# Patient Record
Sex: Female | Born: 1985 | Race: White | Hispanic: No | Smoking: Former smoker
Health system: Southern US, Community
[De-identification: ages and names within clinical notes are randomized; demographics above are authoritative.]

## PROBLEM LIST (undated history)

## (undated) ENCOUNTER — Inpatient Hospital Stay (HOSPITAL_COMMUNITY): Payer: Self-pay

## (undated) DIAGNOSIS — F32A Depression, unspecified: Secondary | ICD-10-CM

## (undated) DIAGNOSIS — B999 Unspecified infectious disease: Secondary | ICD-10-CM

## (undated) DIAGNOSIS — F1911 Other psychoactive substance abuse, in remission: Secondary | ICD-10-CM

## (undated) DIAGNOSIS — F329 Major depressive disorder, single episode, unspecified: Secondary | ICD-10-CM

## (undated) DIAGNOSIS — A549 Gonococcal infection, unspecified: Secondary | ICD-10-CM

## (undated) DIAGNOSIS — F419 Anxiety disorder, unspecified: Secondary | ICD-10-CM

## (undated) DIAGNOSIS — K8020 Calculus of gallbladder without cholecystitis without obstruction: Secondary | ICD-10-CM

## (undated) HISTORY — PX: THERAPEUTIC ABORTION: SHX798

## (undated) HISTORY — DX: Calculus of gallbladder without cholecystitis without obstruction: K80.20

## (undated) HISTORY — PX: NO PAST SURGERIES: SHX2092

## (undated) HISTORY — DX: Other psychoactive substance abuse, in remission: F19.11

## (undated) HISTORY — PX: WISDOM TOOTH EXTRACTION: SHX21

---
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
## 2013-07-22 DIAGNOSIS — F4310 Post-traumatic stress disorder, unspecified: Secondary | ICD-10-CM | POA: Insufficient documentation

## 2015-08-25 DIAGNOSIS — F902 Attention-deficit hyperactivity disorder, combined type: Secondary | ICD-10-CM | POA: Insufficient documentation

## 2016-09-12 NOTE — L&D Delivery Note (Signed)
Patient is 31 y.o. [id number] 4220w6d admitted for preterm labor. 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 Proceeded to delivery with expectant management, AROM just before head delivered.   Delivery Note At 9:45 AM a viable female was delivered via Vaginal, Spontaneous Delivery (Presentation: LOA ).  APGAR: 6, 8; weight: pending .   Placenta status: to pathology.  Cord: 3 vessel, none present.  Cord pH: N/A  Anesthesia:  epidural Episiotomy:  N/A Lacerations:  None Est. Blood Loss (mL):  100  Mom to postpartum.  Baby to Couplet care / Skin to Skin - NICU present for delivery given poor dating.   Upon arrival patient was complete and pushing. She pushed with good maternal effort to deliver a viable female infant in cephalic, LOA position. No nuchal cord present. Baby delivered without difficulty, was noted to have good tone and place on maternal abdomen for oral suctioning, drying and stimulation. Baby then taken to warmer for evaluation by NICU. Delayed cord clamping performed. Placenta delivered spontaneously with gentle cord traction. Fundus firm with massage and Pitocin. Perineum inspected and found to have no laceration. Counts of sharps, instruments, and lap pads were all correct.  Philipp DeputyKim Ramiel Forti, CNM, present for entire delivery.  Burna CashJenna E. Diggs, MD Family Medicine Resident, PGY-3 05/23/2017 10:08 AM  Patient is a [id number] at 7020w6d who was admitted in preterm labor, significant hx of no prenatal care, poor dating, methadone use, gHTN, anxiety.  She progressed without augmentation to SVD.  I was gloved and present for delivery in its entirety.  Second stage of labor progressed, baby delivered after 1-2 contractions.  No decels during second stage noted.  Complications: none  Lacerations: none  EBL: 100cc  Infant to warmer and awaiting Peds team after delayed cord clamping/skin to  skin for eval due to poor dating.  Cam HaiSHAW, Aunya Lemler, CNM 12:09 PM 05/23/2017

## 2017-02-17 LAB — LAB REPORT - SCANNED: Preg Test, Ur: POSITIVE

## 2017-03-22 ENCOUNTER — Encounter: Payer: Self-pay | Admitting: *Deleted

## 2017-04-14 ENCOUNTER — Encounter (HOSPITAL_COMMUNITY): Payer: Self-pay

## 2017-04-14 ENCOUNTER — Ambulatory Visit (INDEPENDENT_AMBULATORY_CARE_PROVIDER_SITE_OTHER): Payer: Self-pay | Admitting: Obstetrics & Gynecology

## 2017-04-14 ENCOUNTER — Encounter: Payer: Self-pay | Admitting: Obstetrics & Gynecology

## 2017-04-14 ENCOUNTER — Inpatient Hospital Stay (HOSPITAL_COMMUNITY)
Admission: AD | Admit: 2017-04-14 | Discharge: 2017-04-14 | Disposition: A | Discharge status: Home / Self Care | Payer: Medicaid Other | Source: Ambulatory Visit | Attending: Obstetrics & Gynecology | Admitting: Obstetrics & Gynecology

## 2017-04-14 DIAGNOSIS — Z79899 Other long term (current) drug therapy: Secondary | ICD-10-CM

## 2017-04-14 DIAGNOSIS — Z113 Encounter for screening for infections with a predominantly sexual mode of transmission: Secondary | ICD-10-CM | POA: Diagnosis not present

## 2017-04-14 DIAGNOSIS — O0990 Supervision of high risk pregnancy, unspecified, unspecified trimester: Secondary | ICD-10-CM | POA: Insufficient documentation

## 2017-04-14 DIAGNOSIS — O0993 Supervision of high risk pregnancy, unspecified, third trimester: Secondary | ICD-10-CM

## 2017-04-14 DIAGNOSIS — Z124 Encounter for screening for malignant neoplasm of cervix: Secondary | ICD-10-CM | POA: Diagnosis not present

## 2017-04-14 DIAGNOSIS — Z3A32 32 weeks gestation of pregnancy: Secondary | ICD-10-CM

## 2017-04-14 DIAGNOSIS — O99320 Drug use complicating pregnancy, unspecified trimester: Secondary | ICD-10-CM

## 2017-04-14 DIAGNOSIS — Z3689 Encounter for other specified antenatal screening: Secondary | ICD-10-CM | POA: Diagnosis not present

## 2017-04-14 DIAGNOSIS — F1120 Opioid dependence, uncomplicated: Secondary | ICD-10-CM | POA: Diagnosis not present

## 2017-04-14 DIAGNOSIS — O99333 Smoking (tobacco) complicating pregnancy, third trimester: Secondary | ICD-10-CM | POA: Diagnosis not present

## 2017-04-14 DIAGNOSIS — O0930 Supervision of pregnancy with insufficient antenatal care, unspecified trimester: Secondary | ICD-10-CM

## 2017-04-14 DIAGNOSIS — O99323 Drug use complicating pregnancy, third trimester: Secondary | ICD-10-CM | POA: Insufficient documentation

## 2017-04-14 DIAGNOSIS — O0933 Supervision of pregnancy with insufficient antenatal care, third trimester: Secondary | ICD-10-CM | POA: Insufficient documentation

## 2017-04-14 DIAGNOSIS — Z23 Encounter for immunization: Secondary | ICD-10-CM

## 2017-04-14 DIAGNOSIS — R85611 Atypical squamous cells cannot exclude high grade squamous intraepithelial lesion on cytologic smear of anus (ASC-H): Secondary | ICD-10-CM

## 2017-04-14 DIAGNOSIS — F17210 Nicotine dependence, cigarettes, uncomplicated: Secondary | ICD-10-CM | POA: Diagnosis not present

## 2017-04-14 DIAGNOSIS — F419 Anxiety disorder, unspecified: Secondary | ICD-10-CM | POA: Insufficient documentation

## 2017-04-14 DIAGNOSIS — F1910 Other psychoactive substance abuse, uncomplicated: Secondary | ICD-10-CM | POA: Insufficient documentation

## 2017-04-14 LAB — COMPREHENSIVE METABOLIC PANEL
ALT: 10 U/L — ABNORMAL LOW (ref 14–54)
AST: 15 U/L (ref 15–41)
Albumin: 2.9 g/dL — ABNORMAL LOW (ref 3.5–5.0)
Alkaline Phosphatase: 55 U/L (ref 38–126)
Anion gap: 9 (ref 5–15)
BUN: 7 mg/dL (ref 6–20)
CO2: 22 mmol/L (ref 22–32)
Calcium: 8.3 mg/dL — ABNORMAL LOW (ref 8.9–10.3)
Chloride: 101 mmol/L (ref 101–111)
Creatinine, Ser: 0.47 mg/dL (ref 0.44–1.00)
GFR calc Af Amer: >60 mL/min (ref >60)
GFR calc non Af Amer: >60 mL/min (ref >60)
Glucose, Bld: 103 mg/dL — ABNORMAL HIGH (ref 65–99)
Potassium: 3.5 mmol/L (ref 3.5–5.1)
Sodium: 132 mmol/L — ABNORMAL LOW (ref 135–145)
Total Bilirubin: 0.3 mg/dL (ref 0.3–1.2)
Total Protein: 6.5 g/dL (ref 6.5–8.1)

## 2017-04-14 LAB — POCT URINALYSIS DIP (DEVICE)
Bilirubin Urine: NEGATIVE
Bilirubin Urine: NEGATIVE
Glucose, UA: NEGATIVE mg/dL
Glucose, UA: NEGATIVE mg/dL
Hgb urine dipstick: NEGATIVE
Hgb urine dipstick: NEGATIVE
Ketones, ur: NEGATIVE mg/dL
Ketones, ur: NEGATIVE mg/dL
Leukocytes, UA: NEGATIVE
Leukocytes, UA: NEGATIVE
Nitrite: NEGATIVE
Nitrite: NEGATIVE
Protein, ur: NEGATIVE mg/dL
Protein, ur: NEGATIVE mg/dL
Specific Gravity, Urine: 1.015 (ref 1.005–1.030)
Specific Gravity, Urine: 1.02 (ref 1.005–1.030)
Urobilinogen, UA: 1 mg/dL (ref 0.0–1.0)
Urobilinogen, UA: 1 mg/dL (ref 0.0–1.0)
pH: 7 (ref 5.0–8.0)
pH: 7 (ref 5.0–8.0)

## 2017-04-14 LAB — CBC
HCT: 32.5 % — ABNORMAL LOW (ref 36.0–46.0)
Hemoglobin: 11.1 g/dL — ABNORMAL LOW (ref 12.0–15.0)
MCH: 29.4 pg (ref 26.0–34.0)
MCHC: 34.2 g/dL (ref 30.0–36.0)
MCV: 86.2 fL (ref 78.0–100.0)
Platelets: 171 10*3/uL (ref 150–400)
RBC: 3.77 MIL/uL — ABNORMAL LOW (ref 3.87–5.11)
RDW: 12.4 % (ref 11.5–15.5)
WBC: 6.8 10*3/uL (ref 4.0–10.5)

## 2017-04-14 LAB — PROTEIN / CREATININE RATIO, URINE
Creatinine, Urine: 173 mg/dL
Protein Creatinine Ratio: 0.09 mg/mg{Cre} (ref 0.00–0.15)
Total Protein, Urine: 16 mg/dL

## 2017-04-14 MED ORDER — ONDANSETRON 8 MG PO TBDP: 8.0000 mg | ORAL_TABLET | Freq: Three times a day (TID) | ORAL | 0 refills | Status: DC | PRN

## 2017-04-14 NOTE — Progress Notes (Signed)
   PRENATAL VISIT NOTE  Subjective:  Gina Foley is a 31 y.o. [phone] at 8666w5d being seen today for ongoing prenatal care.  She is currently monitored for the following issues for this high-risk pregnancy and has Supervision of high risk pregnancy, antepartum; Late prenatal care; Chronic prescription benzodiazepine use; and Drug abuse on her problem list.   She believes her LMP was 08/28/2016. States that in January, February, March she had light periods lasting 3 days. Took pregnancy test in January but came back negative. Admits to a positive pregnancy test at the beginning of March and is now seeking prenatal care.  Past medical history is as follows: [facility]. On 95 mg QD every morning. Admits to marijuana use during pregnancy, last used 2-3 weeks ago. Also has history of anxiety, panic attacks, PTSD. Takes Klonopin. [facility]   [facility] is [facility] about doing [facility]. Has an appointment on August 7th to look at different families.  Patient reports nausea. Requesting Zofran, has used in past pregnancies.  Contractions: Not present. Vag. Bleeding: None.  Movement: Present. Denies leaking of fluid.   The following portions of the patient's history were reviewed and updated as appropriate: allergies, current medications, past family history, past medical history, past social history, past surgical history and problem list. Problem list updated.  Objective:   Vitals:   04/14/17 1012 04/14/17 1014  BP: (!) 140/99   Pulse: (!) 108   Weight: 170 lb 1.6 oz (77.2 kg)   Height:  5\' 3"  (1.6 m)    Fetal Status: Fetal Heart Rate (bpm): 118 Fundal Height: 34 cm Movement: Present     General:  Alert, oriented and cooperative. Patient is in no acute distress.  Skin: Skin is warm and dry. No rash noted.   Cardiovascular: Normal heart rate noted  Respiratory: Normal respiratory effort, no problems with  respiration noted  Abdomen: Soft, gravid, appropriate for gestational age.  Pain/Pressure: Present     Pelvic: Cervical exam performed Dilation: Fingertip      Extremities: Normal range of motion.  Edema: Trace  Mental Status:  Normal mood and affect. Normal behavior. Normal judgment and thought content.   Assessment and Plan:  Pregnancy: G6P3003 at 5366w5d  1. Supervision of high risk pregnancy, antepartum Patient last to establish care. FHR was variable in clinic, ranging from 110-120's. Additionally, dating is questionable. In clinic, she is hypertensive at 140/99. Will admit to MAU for further assessment of blood pressure and ultrasound for fetal surveillance and dating. - Obstetric Panel, Including HIV - Culture, OB Urine - Urine drug screen ordered - 1 hr GTT, patient not fasting ordered - Cystic Fibrosis Mutation 97 - Pap completed today. Last pap in 2017 with last pregnancy, unknown results.  Preterm labor symptoms and general obstetric precautions including but not limited to vaginal bleeding, contractions, leaking of fluid and fetal movement were reviewed in detail with the patient. Please refer to After Visit Summary for other counseling recommendations.  Return in about 1 week (around 04/21/2017).   Jeanie CooksSarah Cassidy Tabet, Medical Student

## 2017-04-14 NOTE — MAU Provider Note (Signed)
History     CSN: [id number]  Arrival date and time: 04/14/17 1141   First Provider Initiated Contact with Patient 04/14/17 1227      Chief Complaint  Patient presents with  . Non-stress Test   Gina Foley is a 31 y.o. [id number] at 5163w5d who presents today from clinic. She was seen today for her first prenatal visit, and had B/P 140/99. She reports that she was very anxious and stressed on arrival because she was lost. She denies any HA, visual disturbance or epigastric pain. She reports normal fetal movement. She denies any VB or LOF.    Past Medical History:  Diagnosis Date  . Gallstones     Past Surgical History:  Procedure Laterality Date  . WISDOM TOOTH EXTRACTION      Family History  Problem Relation Age of Onset  . Family history unknown: Yes    Social History  Substance Use Topics  . Smoking status: Current Every Day Smoker    Packs/day: 0.25    Types: Cigarettes  . Smokeless tobacco: Never Used  . Alcohol use No    Allergies: No Known Allergies  Prescriptions Prior to Admission  Medication Sig Dispense Refill Last Dose  . calcium carbonate (TUMS - DOSED IN MG ELEMENTAL CALCIUM) 500 MG chewable tablet Chew 1-2 tablets by mouth daily.   04/13/2017 at Unknown time  . clonazePAM (KLONOPIN) 1 MG tablet Take 1 tab 3 times a day   04/14/2017 at Unknown time  . methadone (DOLOPHINE) 10 MG/ML solution Take 95 mg by mouth daily.   04/14/2017 at Unknown time  . Prenatal Vit-Fe Fumarate-FA (MULTIVITAMIN-PRENATAL) 27-0.8 MG TABS tablet Take 1 tablet by mouth daily at 12 noon.   04/13/2017 at Unknown time    Review of Systems  Constitutional: Negative for chills and fever.  Eyes: Negative for visual disturbance.  Gastrointestinal: Negative for abdominal pain.  Genitourinary: Negative for pelvic pain, vaginal bleeding and vaginal discharge.  Neurological: Negative for headaches.   Physical Exam   Blood pressure 119/76, pulse 85, temperature (!) 97 F (36.1 C),  temperature source Oral, resp. rate 16, last menstrual period 08/28/2016, SpO2 98 %.  Physical Exam  Nursing note and vitals reviewed. Constitutional: She is oriented to person, place, and time. She appears well-developed and well-nourished. No distress.  HENT:  Head: Normocephalic.  Cardiovascular: Normal rate.   Respiratory: Effort normal.  GI: Soft. There is no tenderness. There is no rebound.  Musculoskeletal: Normal range of motion.  Neurological: She is alert and oriented to person, place, and time.  Skin: Skin is warm and dry.  Psychiatric: She has a normal mood and affect.   FHT: 120, moderate with 15x15 accels, no decels Toco: rare UC   Results for orders placed or performed during the hospital encounter of 04/14/17 (from the past 24 hour(s))  CBC     Status: Abnormal   Collection Time: 04/14/17 12:27 PM  Result Value Ref Range   WBC 6.8 4.0 - 10.5 K/uL   RBC 3.77 (L) 3.87 - 5.11 MIL/uL   Hemoglobin 11.1 (L) 12.0 - 15.0 g/dL   HCT 14.732.5 (L) 82.936.0 - 56.246.0 %   MCV 86.2 78.0 - 100.0 fL   MCH 29.4 26.0 - 34.0 pg   MCHC 34.2 30.0 - 36.0 g/dL   RDW 13.012.4 86.511.5 - 78.415.5 %   Platelets 171 150 - 400 K/uL  Comprehensive metabolic panel     Status: Abnormal   Collection Time: 04/14/17 12:27 PM  Result Value Ref Range   Sodium 132 (L) 135 - 145 mmol/L   Potassium 3.5 3.5 - 5.1 mmol/L   Chloride 101 101 - 111 mmol/L   CO2 22 22 - 32 mmol/L   Glucose, Bld 103 (H) 65 - 99 mg/dL   BUN 7 6 - 20 mg/dL   Creatinine, Ser 1.610.47 0.44 - 1.00 mg/dL   Calcium 8.3 (L) 8.9 - 10.3 mg/dL   Total Protein 6.5 6.5 - 8.1 g/dL   Albumin 2.9 (L) 3.5 - 5.0 g/dL   AST 15 15 - 41 U/L   ALT 10 (L) 14 - 54 U/L   Alkaline Phosphatase 55 38 - 126 U/L   Total Bilirubin 0.3 0.3 - 1.2 mg/dL   GFR calc non Af Amer >60 >60 mL/min   GFR calc Af Amer >60 >60 mL/min   Anion gap 9 5 - 15  Protein / creatinine ratio, urine     Status: None   Collection Time: 04/14/17 12:41 PM  Result Value Ref Range   Creatinine,  Urine 173.00 mg/dL   Total Protein, Urine 16 mg/dL   Protein Creatinine Ratio 0.09 0.00 - 0.15 mg/mg[Cre]    MAU Course  Procedures  MDM Patient has to leave to pick up her children. NST reactive and blood pressure is normal at this time. She does not have any pre-eclampsia sx at this time. Will get labs. Patient verified phone number, and knows she will have to come back if anything is abnormal. Will schedule outpatient US for fetal anatomy.   1443: Reviewed labs.   Assessment and Plan   1. NST (non-stress test) reactive   2. [redacted] weeks gestation of pregnancy   3. Supervision of high risk pregnancy, antepartum   4. Late prenatal care   5. Chronic prescription benzodiazepine use   6. Methadone maintenance treatment affecting pregnancy, antepartum (HCC)    DC home Comfort measures reviewed  3rd Trimester precautions  Pre-eclampsia warning signs reviewed  PTL precautions  Fetal kick counts RX: zofran OD as directed #20  Return to MAU as needed FU with OB as planned  Follow-up Information    [facility] week([facility]).   Specialty:  Obstetrics and Gynecology Contact information: [address] [place] [id number] [phone]           Thressa ShellerHeather Hogan 04/14/2017, 12:43 PM

## 2017-04-14 NOTE — MAU Note (Signed)
Pt sent from office for prolonged monitoring and ultrasound. No prenatal care. On Methadone. Unsure of LMP.

## 2017-04-14 NOTE — Progress Notes (Signed)
Per Dr. Debroah LoopArnold Patient will be sent to MAU for STAT ultra sound and continue heart monitoring, fetal heart rate less than 120. I spoke to charge RN Fleet ContrasRachel and gave her report on patient.

## 2017-04-14 NOTE — Discharge Instructions (Signed)
Third Trimester of Pregnancy The third trimester is from week 28 through week 40 (months 7 through 9). The third trimester is a time when the unborn baby (fetus) is growing rapidly. At the end of the ninth month, the fetus is about 20 inches in length and weighs 6-10 pounds. Body changes during your third trimester Your body will continue to go through many changes during pregnancy. The changes vary from woman to woman. During the third trimester:  Your weight will continue to increase. You can expect to gain 25-35 pounds (11-16 kg) by the end of the pregnancy.  You may begin to get stretch marks on your hips, abdomen, and breasts.  You may urinate more often because the fetus is moving lower into your pelvis and pressing on your bladder.  You may develop or continue to have heartburn. This is caused by increased hormones that slow down muscles in the digestive tract.  You may develop or continue to have constipation because increased hormones slow digestion and cause the muscles that push waste through your intestines to relax.  You may develop hemorrhoids. These are swollen veins (varicose veins) in the rectum that can itch or be painful.  You may develop swollen, bulging veins (varicose veins) in your legs.  You may have increased body aches in the pelvis, back, or thighs. This is due to weight gain and increased hormones that are relaxing your joints.  You may have changes in your hair. These can include thickening of your hair, rapid growth, and changes in texture. Some women also have hair loss during or after pregnancy, or hair that feels dry or thin. Your hair will most likely return to normal after your baby is born.  Your breasts will continue to grow and they will continue to become tender. A yellow fluid (colostrum) may leak from your breasts. This is the first milk you are producing for your baby.  Your belly button may stick out.  You may notice more swelling in your hands,  face, or ankles.  You may have increased tingling or numbness in your hands, arms, and legs. The skin on your belly may also feel numb.  You may feel short of breath because of your expanding uterus.  You may have more problems sleeping. This can be caused by the size of your belly, increased need to urinate, and an increase in your body's metabolism.  You may notice the fetus "dropping," or moving lower in your abdomen (lightening).  You may have increased vaginal discharge.  You may notice your joints feel loose and you may have pain around your pelvic bone.  What to expect at prenatal visits You will have prenatal exams every 2 weeks until week 36. Then you will have weekly prenatal exams. During a routine prenatal visit:  You will be weighed to make sure you and the baby are growing normally.  Your blood pressure will be taken.  Your abdomen will be measured to track your baby's growth.  The fetal heartbeat will be listened to.  Any test results from the previous visit will be discussed.  You may have a cervical check near your due date to see if your cervix has softened or thinned (effaced).  You will be tested for Group B streptococcus. This happens between 35 and 37 weeks.  Your health care provider may ask you:  What your birth plan is.  How you are feeling.  If you are feeling the baby move.  If you have had   any abnormal symptoms, such as leaking fluid, bleeding, severe headaches, or abdominal cramping.  If you are using any tobacco products, including cigarettes, chewing tobacco, and electronic cigarettes.  If you have any questions.  Other tests or screenings that may be performed during your third trimester include:  Blood tests that check for low iron levels (anemia).  Fetal testing to check the health, activity level, and growth of the fetus. Testing is done if you have certain medical conditions or if there are problems during the  pregnancy.  Nonstress test (NST). This test checks the health of your baby to make sure there are no signs of problems, such as the baby not getting enough oxygen. During this test, a belt is placed around your belly. The baby is made to move, and its heart rate is monitored during movement.  What is false labor? False labor is a condition in which you feel small, irregular tightenings of the muscles in the womb (contractions) that usually go away with rest, changing position, or drinking water. These are called Braxton Hicks contractions. Contractions may last for hours, days, or even weeks before true labor sets in. If contractions come at regular intervals, become more frequent, increase in intensity, or become painful, you should see your health care provider. What are the signs of labor?  Abdominal cramps.  Regular contractions that start at 10 minutes apart and become stronger and more frequent with time.  Contractions that start on the top of the uterus and spread down to the lower abdomen and back.  Increased pelvic pressure and dull back pain.  A watery or bloody mucus discharge that comes from the vagina.  Leaking of amniotic fluid. This is also known as your "water breaking." It could be a slow trickle or a gush. Let your health care provider know if it has a color or strange odor. If you have any of these signs, call your health care provider right away, even if it is before your due date. Follow these instructions at home: Medicines  Follow your health care provider's instructions regarding medicine use. Specific medicines may be either safe or unsafe to take during pregnancy.  Take a prenatal vitamin that contains at least 600 micrograms (mcg) of folic acid.  If you develop constipation, try taking a stool softener if your health care provider approves. Eating and drinking  Eat a balanced diet that includes fresh fruits and vegetables, whole grains, good sources of protein  such as meat, eggs, or tofu, and low-fat dairy. Your health care provider will help you determine the amount of weight gain that is right for you.  Avoid raw meat and uncooked cheese. These carry germs that can cause birth defects in the baby.  If you have low calcium intake from food, talk to your health care provider about whether you should take a daily calcium supplement.  Eat four or five small meals rather than three large meals a day.  Limit foods that are high in fat and processed sugars, such as fried and sweet foods.  To prevent constipation: ? Drink enough fluid to keep your urine clear or pale yellow. ? Eat foods that are high in fiber, such as fresh fruits and vegetables, whole grains, and beans. Activity  Exercise only as directed by your health care provider. Most women can continue their usual exercise routine during pregnancy. Try to exercise for 30 minutes at least 5 days a week. Stop exercising if you experience uterine contractions.  Avoid heavy   lifting.  Do not exercise in extreme heat or humidity, or at high altitudes.  Wear low-heel, comfortable shoes.  Practice good posture.  You may continue to have sex unless your health care provider tells you otherwise. Relieving pain and discomfort  Take frequent breaks and rest with your legs elevated if you have leg cramps or low back pain.  Take warm sitz baths to soothe any pain or discomfort caused by hemorrhoids. Use hemorrhoid cream if your health care provider approves.  Wear a good support bra to prevent discomfort from breast tenderness.  If you develop varicose veins: ? Wear support pantyhose or compression stockings as told by your healthcare provider. ? Elevate your feet for 15 minutes, 3-4 times a day. Prenatal care  Write down your questions. Take them to your prenatal visits.  Keep all your prenatal visits as told by your health care provider. This is important. Safety  Wear your seat belt at  all times when driving.  Make a list of emergency phone numbers, including numbers for family, friends, the hospital, and police and fire departments. General instructions  Avoid cat litter boxes and soil used by cats. These carry germs that can cause birth defects in the baby. If you have a cat, ask someone to clean the litter box for you.  Do not travel far distances unless it is absolutely necessary and only with the approval of your health care provider.  Do not use hot tubs, steam rooms, or saunas.  Do not drink alcohol.  Do not use any products that contain nicotine or tobacco, such as cigarettes and e-cigarettes. If you need help quitting, ask your health care provider.  Do not use any medicinal herbs or unprescribed drugs. These chemicals affect the formation and growth of the baby.  Do not douche or use tampons or scented sanitary pads.  Do not cross your legs for long periods of time.  To prepare for the arrival of your baby: ? Take prenatal classes to understand, practice, and ask questions about labor and delivery. ? Make a trial run to the hospital. ? Visit the hospital and tour the maternity area. ? Arrange for maternity or paternity leave through employers. ? Arrange for family and friends to take care of pets while you are in the hospital. ? Purchase a rear-facing car seat and make sure you know how to install it in your car. ? Pack your hospital bag. ? Prepare the baby's nursery. Make sure to remove all pillows and stuffed animals from the baby's crib to prevent suffocation.  Visit your dentist if you have not gone during your pregnancy. Use a soft toothbrush to brush your teeth and be gentle when you floss. Contact a health care provider if:  You are unsure if you are in labor or if your water has broken.  You become dizzy.  You have mild pelvic cramps, pelvic pressure, or nagging pain in your abdominal area.  You have lower back pain.  You have persistent  nausea, vomiting, or diarrhea.  You have an unusual or bad smelling vaginal discharge.  You have pain when you urinate. Get help right away if:  Your water breaks before 37 weeks.  You have regular contractions less than 5 minutes apart before 37 weeks.  You have a fever.  You are leaking fluid from your vagina.  You have spotting or bleeding from your vagina.  You have severe abdominal pain or cramping.  You have rapid weight loss or weight gain.    You have shortness of breath with chest pain.  You notice sudden or extreme swelling of your face, hands, ankles, feet, or legs.  Your baby makes fewer than 10 movements in 2 hours.  You have severe headaches that do not go away when you take medicine.  You have vision changes. Summary  The third trimester is from week 28 through week 40, months 7 through 9. The third trimester is a time when the unborn baby (fetus) is growing rapidly.  During the third trimester, your discomfort may increase as you and your baby continue to gain weight. You may have abdominal, leg, and back pain, sleeping problems, and an increased need to urinate.  During the third trimester your breasts will keep growing and they will continue to become tender. A yellow fluid (colostrum) may leak from your breasts. This is the first milk you are producing for your baby.  False labor is a condition in which you feel small, irregular tightenings of the muscles in the womb (contractions) that eventually go away. These are called Braxton Hicks contractions. Contractions may last for hours, days, or even weeks before true labor sets in.  Signs of labor can include: abdominal cramps; regular contractions that start at 10 minutes apart and become stronger and more frequent with time; watery or bloody mucus discharge that comes from the vagina; increased pelvic pressure and dull back pain; and leaking of amniotic fluid. This information is not intended to replace advice  given to you by your health care provider. Make sure you discuss any questions you have with your health care provider. Document Released: 08/23/2001 Document Revised: 02/04/2016 Document Reviewed: 10/30/2012 Elsevier Interactive Patient Education  2017 [employer]  

## 2017-04-14 NOTE — Patient Instructions (Signed)
Third Trimester of Pregnancy The third trimester is from week 28 through week 40 (months 7 through 9). The third trimester is a time when the unborn baby (fetus) is growing rapidly. At the end of the ninth month, the fetus is about 20 inches in length and weighs 6-10 pounds. Body changes during your third trimester Your body will continue to go through many changes during pregnancy. The changes vary from woman to woman. During the third trimester:  Your weight will continue to increase. You can expect to gain 25-35 pounds (11-16 kg) by the end of the pregnancy.  You may begin to get stretch marks on your hips, abdomen, and breasts.  You may urinate more often because the fetus is moving lower into your pelvis and pressing on your bladder.  You may develop or continue to have heartburn. This is caused by increased hormones that slow down muscles in the digestive tract.  You may develop or continue to have constipation because increased hormones slow digestion and cause the muscles that push waste through your intestines to relax.  You may develop hemorrhoids. These are swollen veins (varicose veins) in the rectum that can itch or be painful.  You may develop swollen, bulging veins (varicose veins) in your legs.  You may have increased body aches in the pelvis, back, or thighs. This is due to weight gain and increased hormones that are relaxing your joints.  You may have changes in your hair. These can include thickening of your hair, rapid growth, and changes in texture. Some women also have hair loss during or after pregnancy, or hair that feels dry or thin. Your hair will most likely return to normal after your baby is born.  Your breasts will continue to grow and they will continue to become tender. A yellow fluid (colostrum) may leak from your breasts. This is the first milk you are producing for your baby.  Your belly button may stick out.  You may notice more swelling in your hands,  face, or ankles.  You may have increased tingling or numbness in your hands, arms, and legs. The skin on your belly may also feel numb.  You may feel short of breath because of your expanding uterus.  You may have more problems sleeping. This can be caused by the size of your belly, increased need to urinate, and an increase in your body's metabolism.  You may notice the fetus "dropping," or moving lower in your abdomen (lightening).  You may have increased vaginal discharge.  You may notice your joints feel loose and you may have pain around your pelvic bone.  What to expect at prenatal visits You will have prenatal exams every 2 weeks until week 36. Then you will have weekly prenatal exams. During a routine prenatal visit:  You will be weighed to make sure you and the baby are growing normally.  Your blood pressure will be taken.  Your abdomen will be measured to track your baby's growth.  The fetal heartbeat will be listened to.  Any test results from the previous visit will be discussed.  You may have a cervical check near your due date to see if your cervix has softened or thinned (effaced).  You will be tested for Group B streptococcus. This happens between 35 and 37 weeks.  Your health care provider may ask you:  What your birth plan is.  How you are feeling.  If you are feeling the baby move.  If you have had   any abnormal symptoms, such as leaking fluid, bleeding, severe headaches, or abdominal cramping.  If you are using any tobacco products, including cigarettes, chewing tobacco, and electronic cigarettes.  If you have any questions.  Other tests or screenings that may be performed during your third trimester include:  Blood tests that check for low iron levels (anemia).  Fetal testing to check the health, activity level, and growth of the fetus. Testing is done if you have certain medical conditions or if there are problems during the  pregnancy.  Nonstress test (NST). This test checks the health of your baby to make sure there are no signs of problems, such as the baby not getting enough oxygen. During this test, a belt is placed around your belly. The baby is made to move, and its heart rate is monitored during movement.  What is false labor? False labor is a condition in which you feel small, irregular tightenings of the muscles in the womb (contractions) that usually go away with rest, changing position, or drinking water. These are called Braxton Hicks contractions. Contractions may last for hours, days, or even weeks before true labor sets in. If contractions come at regular intervals, become more frequent, increase in intensity, or become painful, you should see your health care provider. What are the signs of labor?  Abdominal cramps.  Regular contractions that start at 10 minutes apart and become stronger and more frequent with time.  Contractions that start on the top of the uterus and spread down to the lower abdomen and back.  Increased pelvic pressure and dull back pain.  A watery or bloody mucus discharge that comes from the vagina.  Leaking of amniotic fluid. This is also known as your "water breaking." It could be a slow trickle or a gush. Let your health care provider know if it has a color or strange odor. If you have any of these signs, call your health care provider right away, even if it is before your due date. Follow these instructions at home: Medicines  Follow your health care provider's instructions regarding medicine use. Specific medicines may be either safe or unsafe to take during pregnancy.  Take a prenatal vitamin that contains at least 600 micrograms (mcg) of folic acid.  If you develop constipation, try taking a stool softener if your health care provider approves. Eating and drinking  Eat a balanced diet that includes fresh fruits and vegetables, whole grains, good sources of protein  such as meat, eggs, or tofu, and low-fat dairy. Your health care provider will help you determine the amount of weight gain that is right for you.  Avoid raw meat and uncooked cheese. These carry germs that can cause birth defects in the baby.  If you have low calcium intake from food, talk to your health care provider about whether you should take a daily calcium supplement.  Eat four or five small meals rather than three large meals a day.  Limit foods that are high in fat and processed sugars, such as fried and sweet foods.  To prevent constipation: ? Drink enough fluid to keep your urine clear or pale yellow. ? Eat foods that are high in fiber, such as fresh fruits and vegetables, whole grains, and beans. Activity  Exercise only as directed by your health care provider. Most women can continue their usual exercise routine during pregnancy. Try to exercise for 30 minutes at least 5 days a week. Stop exercising if you experience uterine contractions.  Avoid heavy   lifting.  Do not exercise in extreme heat or humidity, or at high altitudes.  Wear low-heel, comfortable shoes.  Practice good posture.  You may continue to have sex unless your health care provider tells you otherwise. Relieving pain and discomfort  Take frequent breaks and rest with your legs elevated if you have leg cramps or low back pain.  Take warm sitz baths to soothe any pain or discomfort caused by hemorrhoids. Use hemorrhoid cream if your health care provider approves.  Wear a good support bra to prevent discomfort from breast tenderness.  If you develop varicose veins: ? Wear support pantyhose or compression stockings as told by your healthcare provider. ? Elevate your feet for 15 minutes, 3-4 times a day. Prenatal care  Write down your questions. Take them to your prenatal visits.  Keep all your prenatal visits as told by your health care provider. This is important. Safety  Wear your seat belt at  all times when driving.  Make a list of emergency phone numbers, including numbers for family, friends, the hospital, and police and fire departments. General instructions  Avoid cat litter boxes and soil used by cats. These carry germs that can cause birth defects in the baby. If you have a cat, ask someone to clean the litter box for you.  Do not travel far distances unless it is absolutely necessary and only with the approval of your health care provider.  Do not use hot tubs, steam rooms, or saunas.  Do not drink alcohol.  Do not use any products that contain nicotine or tobacco, such as cigarettes and e-cigarettes. If you need help quitting, ask your health care provider.  Do not use any medicinal herbs or unprescribed drugs. These chemicals affect the formation and growth of the baby.  Do not douche or use tampons or scented sanitary pads.  Do not cross your legs for long periods of time.  To prepare for the arrival of your baby: ? Take prenatal classes to understand, practice, and ask questions about labor and delivery. ? Make a trial run to the hospital. ? Visit the hospital and tour the maternity area. ? Arrange for maternity or paternity leave through employers. ? Arrange for family and friends to take care of pets while you are in the hospital. ? Purchase a rear-facing car seat and make sure you know how to install it in your car. ? Pack your hospital bag. ? Prepare the baby's nursery. Make sure to remove all pillows and stuffed animals from the baby's crib to prevent suffocation.  Visit your dentist if you have not gone during your pregnancy. Use a soft toothbrush to brush your teeth and be gentle when you floss. Contact a health care provider if:  You are unsure if you are in labor or if your water has broken.  You become dizzy.  You have mild pelvic cramps, pelvic pressure, or nagging pain in your abdominal area.  You have lower back pain.  You have persistent  nausea, vomiting, or diarrhea.  You have an unusual or bad smelling vaginal discharge.  You have pain when you urinate. Get help right away if:  Your water breaks before 37 weeks.  You have regular contractions less than 5 minutes apart before 37 weeks.  You have a fever.  You are leaking fluid from your vagina.  You have spotting or bleeding from your vagina.  You have severe abdominal pain or cramping.  You have rapid weight loss or weight gain.    You have shortness of breath with chest pain.  You notice sudden or extreme swelling of your face, hands, ankles, feet, or legs.  Your baby makes fewer than 10 movements in 2 hours.  You have severe headaches that do not go away when you take medicine.  You have vision changes. Summary  The third trimester is from week 28 through week 40, months 7 through 9. The third trimester is a time when the unborn baby (fetus) is growing rapidly.  During the third trimester, your discomfort may increase as you and your baby continue to gain weight. You may have abdominal, leg, and back pain, sleeping problems, and an increased need to urinate.  During the third trimester your breasts will keep growing and they will continue to become tender. A yellow fluid (colostrum) may leak from your breasts. This is the first milk you are producing for your baby.  False labor is a condition in which you feel small, irregular tightenings of the muscles in the womb (contractions) that eventually go away. These are called Braxton Hicks contractions. Contractions may last for hours, days, or even weeks before true labor sets in.  Signs of labor can include: abdominal cramps; regular contractions that start at 10 minutes apart and become stronger and more frequent with time; watery or bloody mucus discharge that comes from the vagina; increased pelvic pressure and dull back pain; and leaking of amniotic fluid. This information is not intended to replace advice  given to you by your health care provider. Make sure you discuss any questions you have with your health care provider. Document Released: 08/23/2001 Document Revised: 02/04/2016 Document Reviewed: 10/30/2012 Elsevier Interactive Patient Education  2017 [employer]  

## 2017-04-17 LAB — URINE CULTURE, OB REFLEX

## 2017-04-17 LAB — CULTURE, OB URINE

## 2017-04-19 ENCOUNTER — Telehealth: Payer: Self-pay

## 2017-04-19 MED ORDER — NITROFURANTOIN MONOHYD MACRO 100 MG PO CAPS: 100.0000 mg | ORAL_CAPSULE | Freq: Two times a day (BID) | ORAL | 0 refills | Status: DC

## 2017-04-19 NOTE — Telephone Encounter (Signed)
-----   Message from Adam PhenixJames G Arnold, MD sent at 04/17/2017 10:32 AM EDT ----- UTI Rx macrobid 100 mg bid 7 days

## 2017-04-19 NOTE — Telephone Encounter (Signed)
Called patient no answer I have left a message for her to call us back regarding lab work. Macrobid 100 bid x 7 days have been called in.

## 2017-04-20 LAB — CYTOLOGY - PAP
Chlamydia: NEGATIVE
HPV: DETECTED — AB
Neisseria Gonorrhea: NEGATIVE

## 2017-04-21 LAB — OBSTETRIC PANEL, INCLUDING HIV
Antibody Screen: NEGATIVE
Basophils Absolute: 0 10*3/uL (ref 0.0–0.2)
Basos: 0 %
EOS (ABSOLUTE): 0 10*3/uL (ref 0.0–0.4)
Eos: 0 %
HIV Screen 4th Generation wRfx: NONREACTIVE
Hematocrit: 33.1 % — ABNORMAL LOW (ref 34.0–46.6)
Hemoglobin: 11.3 g/dL (ref 11.1–15.9)
Hepatitis B Surface Ag: NEGATIVE
Immature Grans (Abs): 0.1 10*3/uL (ref 0.0–0.1)
Immature Granulocytes: 1 %
Lymphocytes Absolute: 1.7 10*3/uL (ref 0.7–3.1)
Lymphs: 25 %
MCH: 28.8 pg (ref 26.6–33.0)
MCHC: 34.1 g/dL (ref 31.5–35.7)
MCV: 84 fL (ref 79–97)
Monocytes Absolute: 0.5 10*3/uL (ref 0.1–0.9)
Monocytes: 8 %
Neutrophils Absolute: 4.5 10*3/uL (ref 1.4–7.0)
Neutrophils: 66 %
Platelets: 194 10*3/uL (ref 150–379)
RBC: 3.93 x10E6/uL (ref 3.77–5.28)
RDW: 12.4 % (ref 12.3–15.4)
RPR Ser Ql: NONREACTIVE
Rh Factor: POSITIVE
Rubella Antibodies, IGG: <0.9 index — ABNORMAL LOW (ref >0.99)
WBC: 6.7 10*3/uL (ref 3.4–10.8)

## 2017-04-21 LAB — GLUCOSE TOLERANCE, 1 HOUR: Glucose, 1Hr PP: 111 mg/dL (ref 65–199)

## 2017-04-21 LAB — CYSTIC FIBROSIS MUTATION 97: Interpretation: NOT DETECTED

## 2017-04-26 DIAGNOSIS — R87610 Atypical squamous cells of undetermined significance on cytologic smear of cervix (ASC-US): Secondary | ICD-10-CM | POA: Insufficient documentation

## 2017-04-26 DIAGNOSIS — R87810 Cervical high risk human papillomavirus (HPV) DNA test positive: Secondary | ICD-10-CM | POA: Insufficient documentation

## 2017-04-26 DIAGNOSIS — R85611 Atypical squamous cells cannot exclude high grade squamous intraepithelial lesion on cytologic smear of anus (ASC-H): Secondary | ICD-10-CM | POA: Insufficient documentation

## 2017-05-03 ENCOUNTER — Telehealth: Payer: Self-pay | Admitting: *Deleted

## 2017-05-03 NOTE — Telephone Encounter (Addendum)
Per chart results papsmear from 04/14/17 showing ASC-H- Atypical squamous cells cannot exclude a high grade squamous lesion. With hpv detected. Is [redacted] weeks pregnant with only one initial prenatal visit. No follow up ordered. Will route to Dr. Debroah Loop to see if he wants colposcopy with next prenatal visit or postpartum.

## 2017-05-10 NOTE — Telephone Encounter (Signed)
Per message from Dr. Debroah LoopArnold will need colposcopy postpartum

## 2017-05-12 NOTE — Telephone Encounter (Signed)
Called patient, no answer- left message stating we are trying to reach you regarding results & an appt. Please call us back

## 2017-05-16 ENCOUNTER — Encounter: Payer: Self-pay | Admitting: *Deleted

## 2017-05-16 NOTE — Telephone Encounter (Signed)
I called Gina Foley and left a message we are trying to reach you with some information, please call our office. Will send letter. Also noted she does not have return ob scheduled. Will forward message to registrars to schedule asap.

## 2017-05-17 ENCOUNTER — Telehealth: Payer: Self-pay | Admitting: General Practice

## 2017-05-17 NOTE — Telephone Encounter (Signed)
Called and notified patient of follow up OB appointment on 05/17/17 at 2:40pm.  Patient voiced understanding.

## 2017-05-18 ENCOUNTER — Ambulatory Visit (INDEPENDENT_AMBULATORY_CARE_PROVIDER_SITE_OTHER): Payer: Medicaid Other | Admitting: Obstetrics and Gynecology

## 2017-05-18 ENCOUNTER — Telehealth: Payer: Self-pay

## 2017-05-18 VITALS — BP 142/73 | HR 84 | Wt 172.1 lb

## 2017-05-18 DIAGNOSIS — O99343 Other mental disorders complicating pregnancy, third trimester: Secondary | ICD-10-CM | POA: Diagnosis not present

## 2017-05-18 DIAGNOSIS — O0930 Supervision of pregnancy with insufficient antenatal care, unspecified trimester: Secondary | ICD-10-CM

## 2017-05-18 DIAGNOSIS — Z283 Underimmunization status: Secondary | ICD-10-CM | POA: Insufficient documentation

## 2017-05-18 DIAGNOSIS — O9989 Other specified diseases and conditions complicating pregnancy, childbirth and the puerperium: Secondary | ICD-10-CM | POA: Diagnosis not present

## 2017-05-18 DIAGNOSIS — O0993 Supervision of high risk pregnancy, unspecified, third trimester: Secondary | ICD-10-CM

## 2017-05-18 DIAGNOSIS — Z113 Encounter for screening for infections with a predominantly sexual mode of transmission: Secondary | ICD-10-CM

## 2017-05-18 DIAGNOSIS — F329 Major depressive disorder, single episode, unspecified: Secondary | ICD-10-CM

## 2017-05-18 DIAGNOSIS — O133 Gestational [pregnancy-induced] hypertension without significant proteinuria, third trimester: Secondary | ICD-10-CM | POA: Diagnosis not present

## 2017-05-18 DIAGNOSIS — O09899 Supervision of other high risk pregnancies, unspecified trimester: Secondary | ICD-10-CM | POA: Insufficient documentation

## 2017-05-18 DIAGNOSIS — F419 Anxiety disorder, unspecified: Secondary | ICD-10-CM

## 2017-05-18 DIAGNOSIS — Z2839 Other underimmunization status: Secondary | ICD-10-CM

## 2017-05-18 DIAGNOSIS — O99323 Drug use complicating pregnancy, third trimester: Secondary | ICD-10-CM | POA: Diagnosis not present

## 2017-05-18 DIAGNOSIS — O0933 Supervision of pregnancy with insufficient antenatal care, third trimester: Secondary | ICD-10-CM | POA: Diagnosis not present

## 2017-05-18 DIAGNOSIS — F1120 Opioid dependence, uncomplicated: Secondary | ICD-10-CM

## 2017-05-18 DIAGNOSIS — Z331 Pregnant state, incidental: Secondary | ICD-10-CM | POA: Diagnosis not present

## 2017-05-18 DIAGNOSIS — O139 Gestational [pregnancy-induced] hypertension without significant proteinuria, unspecified trimester: Secondary | ICD-10-CM | POA: Insufficient documentation

## 2017-05-18 DIAGNOSIS — O0990 Supervision of high risk pregnancy, unspecified, unspecified trimester: Secondary | ICD-10-CM

## 2017-05-18 DIAGNOSIS — F32A Depression, unspecified: Secondary | ICD-10-CM | POA: Insufficient documentation

## 2017-05-18 DIAGNOSIS — O99320 Drug use complicating pregnancy, unspecified trimester: Secondary | ICD-10-CM

## 2017-05-18 MED ORDER — HYDROXYZINE HCL 50 MG PO TABS: 50.0000 mg | ORAL_TABLET | Freq: Three times a day (TID) | ORAL | 0 refills | Status: DC | PRN

## 2017-05-18 NOTE — Patient Instructions (Signed)

## 2017-05-18 NOTE — Progress Notes (Signed)
   PRENATAL VISIT NOTE  Subjective:  Gina Foley is a 31 y.o. [phone] at 6579w4d being seen today for ongoing prenatal care.  She is currently monitored for the following issues for this high-risk pregnancy and has Supervision of high risk pregnancy, antepartum; Late prenatal care; Chronic prescription benzodiazepine use; Drug abuse; Anxiety; Methadone maintenance treatment affecting pregnancy, antepartum (HCC); Pap smear of anus with ASC-H, cannot exclude high-grade lesion; Gestational hypertension; Depression affecting pregnancy in third trimester, antepartum; and Rubella non-immune status, antepartum on her problem list. Patient is requesting refill of Klonopin. Patient states she has not had prenatal care due to her lack of insurance. She states that she has not used any illicit substances during this pregnancy.  Patient reports increased anxiety.  Contractions: Irregular. Vag. Bleeding: None.  Movement: Present. Denies leaking of fluid.  The following portions of the patient's history were reviewed and updated as appropriate: allergies, current medications, past family history, past medical history, past social history, past surgical history and problem list. Problem list updated.  Objective:   Vitals:   05/18/17 1535  BP: (!) 142/73  Pulse: 84  Weight: 172 lb 1.6 oz (78.1 kg)    Fetal Status: Fetal Heart Rate (bpm): 123 Fundal Height: 37 cm Movement: Present     General:  Alert, oriented and cooperative. Patient is in no acute distress.  Skin: Skin is warm and dry. No rash noted.   Cardiovascular: Normal heart rate noted  Respiratory: Normal respiratory effort, no problems with respiration noted  Abdomen: Soft, gravid, appropriate for gestational age. Pain/Pressure: Present     Pelvic:  Cervical exam performed Dilation: Fingertip Effacement (%): Thick Station: -3  Extremities: Normal range of motion.  Edema: Trace  Mental Status: Normal mood and affect. Normal behavior. Normal  judgment and thought content.   Assessment and Plan:  Pregnancy: [id number] at 3379w4d  1. Supervision of high risk pregnancy, antepartum, third trimester - US MFM OB DETAIL +14 WK on 9/10 - GC[facility]lam[facility] - Culture, beta strep (group b only) - Plans to put baby up for adoption, unclear where - Rubella non-immune  2. Depression affecting pregnancy, third trimester - Reports increased anxiety and depression - Pt instructed to follow up with psychiatrist for medical management  - Contacted Dr. Geraldine ContrasEdward Love on behalf of patient who instructed pt to call herself - Prescribed Atarax for anxiety until pt sees psychiatrist  3. Methadone dependence affecting pregnancy - 10mg /ml daily  4. Gestational hypertension - Pt left before we were able to discuss a plan with her, pt called and instructed to return to [facility] - [facility] as soon as possible per antenatal testing guidelines assuming accurate dating - Pre-E labs at next encounter  5. Late to prenatal care/ inconsistent prenatal care - Dating unclear, pt unsure of LMP and has not had ultrasound  Term labor symptoms and general obstetric precautions including but not limited to vaginal bleeding, contractions, leaking of fluid and fetal movement were reviewed in detail with the patient. Please refer to After Visit Summary for other counseling recommendations.  Return in about 1 week (around 05/25/2017) for ob visit.  Dannette Barbararew Revis Whalin, Medical Student

## 2017-05-18 NOTE — Telephone Encounter (Signed)
Called patient on number listed in the chart. Spoke with her mother who stated her phone is broken at this time and patient can only text. I have advised mom to tell patient she will need to report to [facility] asap so that we can evaluate her blood pressure since it was a on the high side today and last visit. Mother voice understanding and I told mom I would follow up with her tomorrow.

## 2017-05-19 LAB — GC/CHLAMYDIA PROBE AMP (~~LOC~~) NOT AT ARMC
Chlamydia: NEGATIVE
Neisseria Gonorrhea: NEGATIVE

## 2017-05-22 ENCOUNTER — Encounter (HOSPITAL_COMMUNITY): Payer: Self-pay

## 2017-05-22 ENCOUNTER — Other Ambulatory Visit: Payer: Self-pay | Admitting: Obstetrics and Gynecology

## 2017-05-22 ENCOUNTER — Ambulatory Visit (HOSPITAL_COMMUNITY)
Admission: RE | Admit: 2017-05-22 | Discharge: 2017-05-22 | Disposition: A | Discharge status: Home / Self Care | Payer: Medicaid Other | Source: Ambulatory Visit | Attending: Obstetrics and Gynecology | Admitting: Obstetrics and Gynecology

## 2017-05-22 DIAGNOSIS — Z3A33 33 weeks gestation of pregnancy: Secondary | ICD-10-CM | POA: Insufficient documentation

## 2017-05-22 DIAGNOSIS — O99323 Drug use complicating pregnancy, third trimester: Secondary | ICD-10-CM | POA: Insufficient documentation

## 2017-05-22 DIAGNOSIS — O99333 Smoking (tobacco) complicating pregnancy, third trimester: Secondary | ICD-10-CM

## 2017-05-22 DIAGNOSIS — O0993 Supervision of high risk pregnancy, unspecified, third trimester: Secondary | ICD-10-CM | POA: Insufficient documentation

## 2017-05-22 DIAGNOSIS — O0933 Supervision of pregnancy with insufficient antenatal care, third trimester: Secondary | ICD-10-CM | POA: Insufficient documentation

## 2017-05-22 DIAGNOSIS — O0990 Supervision of high risk pregnancy, unspecified, unspecified trimester: Secondary | ICD-10-CM

## 2017-05-22 DIAGNOSIS — F1210 Cannabis abuse, uncomplicated: Secondary | ICD-10-CM

## 2017-05-22 DIAGNOSIS — Z3687 Encounter for antenatal screening for uncertain dates: Secondary | ICD-10-CM

## 2017-05-22 DIAGNOSIS — Z363 Encounter for antenatal screening for malformations: Secondary | ICD-10-CM

## 2017-05-23 ENCOUNTER — Inpatient Hospital Stay (HOSPITAL_COMMUNITY)
Admission: AD | Admit: 2017-05-23 | Discharge: 2017-05-24 | DRG: 775 | Disposition: A | Discharge status: Home / Self Care | Payer: Medicaid Other | Source: Ambulatory Visit | Attending: Family Medicine | Admitting: Family Medicine

## 2017-05-23 ENCOUNTER — Encounter (HOSPITAL_COMMUNITY): Payer: Self-pay | Admitting: *Deleted

## 2017-05-23 ENCOUNTER — Other Ambulatory Visit (HOSPITAL_COMMUNITY): Payer: Self-pay | Admitting: *Deleted

## 2017-05-23 ENCOUNTER — Inpatient Hospital Stay (HOSPITAL_COMMUNITY): Payer: Medicaid Other | Admitting: Anesthesiology

## 2017-05-23 DIAGNOSIS — F32A Depression, unspecified: Secondary | ICD-10-CM | POA: Diagnosis present

## 2017-05-23 DIAGNOSIS — F1910 Other psychoactive substance abuse, uncomplicated: Secondary | ICD-10-CM | POA: Diagnosis present

## 2017-05-23 DIAGNOSIS — O99320 Drug use complicating pregnancy, unspecified trimester: Secondary | ICD-10-CM

## 2017-05-23 DIAGNOSIS — O0930 Supervision of pregnancy with insufficient antenatal care, unspecified trimester: Secondary | ICD-10-CM

## 2017-05-23 DIAGNOSIS — Z87891 Personal history of nicotine dependence: Secondary | ICD-10-CM

## 2017-05-23 DIAGNOSIS — Z3A33 33 weeks gestation of pregnancy: Secondary | ICD-10-CM | POA: Diagnosis not present

## 2017-05-23 DIAGNOSIS — O134 Gestational [pregnancy-induced] hypertension without significant proteinuria, complicating childbirth: Secondary | ICD-10-CM | POA: Diagnosis present

## 2017-05-23 DIAGNOSIS — O365930 Maternal care for other known or suspected poor fetal growth, third trimester, not applicable or unspecified: Secondary | ICD-10-CM

## 2017-05-23 DIAGNOSIS — O133 Gestational [pregnancy-induced] hypertension without significant proteinuria, third trimester: Secondary | ICD-10-CM

## 2017-05-23 DIAGNOSIS — F419 Anxiety disorder, unspecified: Secondary | ICD-10-CM | POA: Diagnosis present

## 2017-05-23 DIAGNOSIS — R87610 Atypical squamous cells of undetermined significance on cytologic smear of cervix (ASC-US): Secondary | ICD-10-CM | POA: Diagnosis present

## 2017-05-23 DIAGNOSIS — O99343 Other mental disorders complicating pregnancy, third trimester: Secondary | ICD-10-CM

## 2017-05-23 DIAGNOSIS — O139 Gestational [pregnancy-induced] hypertension without significant proteinuria, unspecified trimester: Secondary | ICD-10-CM | POA: Diagnosis present

## 2017-05-23 DIAGNOSIS — O99324 Drug use complicating childbirth: Secondary | ICD-10-CM | POA: Diagnosis present

## 2017-05-23 DIAGNOSIS — O9989 Other specified diseases and conditions complicating pregnancy, childbirth and the puerperium: Secondary | ICD-10-CM

## 2017-05-23 DIAGNOSIS — F1120 Opioid dependence, uncomplicated: Secondary | ICD-10-CM | POA: Diagnosis present

## 2017-05-23 DIAGNOSIS — F329 Major depressive disorder, single episode, unspecified: Secondary | ICD-10-CM | POA: Diagnosis present

## 2017-05-23 DIAGNOSIS — O0990 Supervision of high risk pregnancy, unspecified, unspecified trimester: Secondary | ICD-10-CM

## 2017-05-23 DIAGNOSIS — R85611 Atypical squamous cells cannot exclude high grade squamous intraepithelial lesion on cytologic smear of anus (ASC-H): Secondary | ICD-10-CM | POA: Diagnosis present

## 2017-05-23 DIAGNOSIS — O99344 Other mental disorders complicating childbirth: Secondary | ICD-10-CM | POA: Diagnosis present

## 2017-05-23 DIAGNOSIS — F1110 Opioid abuse, uncomplicated: Secondary | ICD-10-CM | POA: Diagnosis present

## 2017-05-23 DIAGNOSIS — Z2839 Other underimmunization status: Secondary | ICD-10-CM

## 2017-05-23 DIAGNOSIS — R87810 Cervical high risk human papillomavirus (HPV) DNA test positive: Secondary | ICD-10-CM | POA: Diagnosis present

## 2017-05-23 DIAGNOSIS — Z283 Underimmunization status: Secondary | ICD-10-CM

## 2017-05-23 HISTORY — DX: Unspecified infectious disease: B99.9

## 2017-05-23 HISTORY — DX: Gonococcal infection, unspecified: A54.9

## 2017-05-23 HISTORY — DX: Major depressive disorder, single episode, unspecified: F32.9

## 2017-05-23 HISTORY — DX: Depression, unspecified: F32.A

## 2017-05-23 HISTORY — DX: Anxiety disorder, unspecified: F41.9

## 2017-05-23 LAB — CBC
HCT: 33.9 % — ABNORMAL LOW (ref 36.0–46.0)
Hemoglobin: 11.8 g/dL — ABNORMAL LOW (ref 12.0–15.0)
MCH: 29.7 pg (ref 26.0–34.0)
MCHC: 34.8 g/dL (ref 30.0–36.0)
MCV: 85.4 fL (ref 78.0–100.0)
Platelets: 205 10*3/uL (ref 150–400)
RBC: 3.97 MIL/uL (ref 3.87–5.11)
RDW: 12.9 % (ref 11.5–15.5)
WBC: 6.9 10*3/uL (ref 4.0–10.5)

## 2017-05-23 LAB — CULTURE, BETA STREP (GROUP B ONLY): Strep Gp B Culture: NEGATIVE

## 2017-05-23 LAB — TYPE AND SCREEN
ABO/RH(D): B POS
Antibody Screen: NEGATIVE

## 2017-05-23 LAB — RAPID URINE DRUG SCREEN, HOSP PERFORMED
Amphetamines: NOT DETECTED
Barbiturates: NOT DETECTED
Benzodiazepines: POSITIVE — AB
Cocaine: NOT DETECTED
Opiates: NOT DETECTED
Tetrahydrocannabinol: POSITIVE — AB

## 2017-05-23 LAB — ABO/RH: ABO/RH(D): B POS

## 2017-05-23 LAB — RPR: RPR Ser Ql: NONREACTIVE

## 2017-05-23 MED ORDER — LACTATED RINGERS IV SOLN
500.0000 mL | INTRAVENOUS | Status: DC | PRN
  Administered 2017-05-23: 500 mL via INTRAVENOUS

## 2017-05-23 MED ORDER — SENNOSIDES-DOCUSATE SODIUM 8.6-50 MG PO TABS
2.0000 | ORAL_TABLET | ORAL | Status: DC
  Administered 2017-05-24: 2 via ORAL
  Filled 2017-05-23: qty 2

## 2017-05-23 MED ORDER — METHADONE HCL 10 MG/ML PO CONC: 100.0000 mg | Freq: Every day | ORAL | Status: DC

## 2017-05-23 MED ORDER — DIPHENHYDRAMINE HCL 25 MG PO CAPS: 25.0000 mg | ORAL_CAPSULE | Freq: Four times a day (QID) | ORAL | Status: DC | PRN

## 2017-05-23 MED ORDER — LACTATED RINGERS IV SOLN: 500.0000 mL | Freq: Once | INTRAVENOUS | Status: DC

## 2017-05-23 MED ORDER — LACTATED RINGERS IV SOLN
INTRAVENOUS | Status: DC
  Administered 2017-05-23: 125 mL via INTRAVENOUS

## 2017-05-23 MED ORDER — OXYCODONE-ACETAMINOPHEN 5-325 MG PO TABS: 1.0000 | ORAL_TABLET | ORAL | Status: DC | PRN

## 2017-05-23 MED ORDER — OXYCODONE-ACETAMINOPHEN 5-325 MG PO TABS: 2.0000 | ORAL_TABLET | ORAL | Status: DC | PRN

## 2017-05-23 MED ORDER — COCONUT OIL OIL: 1.0000 "application " | TOPICAL_OIL | Status: DC | PRN

## 2017-05-23 MED ORDER — PENICILLIN G POTASSIUM 5000000 UNITS IJ SOLR
5.0000 10*6.[IU] | Freq: Once | INTRAVENOUS | Status: AC
  Administered 2017-05-23: 5 10*6.[IU] via INTRAVENOUS
  Filled 2017-05-23: qty 5

## 2017-05-23 MED ORDER — METHADONE HCL 10 MG/ML PO CONC
100.0000 mg | Freq: Every day | ORAL | Status: DC
  Administered 2017-05-24: 100 mg via ORAL
  Filled 2017-05-23 (×2): qty 10

## 2017-05-23 MED ORDER — PENICILLIN G POTASSIUM 5000000 UNITS IJ SOLR: 2.5000 10*6.[IU] | INTRAVENOUS | Status: DC

## 2017-05-23 MED ORDER — SOD CITRATE-CITRIC ACID 500-334 MG/5ML PO SOLN
30.0000 mL | ORAL | Status: DC | PRN
Start: 2017-05-23 — End: 2017-05-23

## 2017-05-23 MED ORDER — SIMETHICONE 80 MG PO CHEW: 80.0000 mg | CHEWABLE_TABLET | ORAL | Status: DC | PRN

## 2017-05-23 MED ORDER — EPHEDRINE 5 MG/ML INJ
10.0000 mg | INTRAVENOUS | Status: DC | PRN
  Filled 2017-05-23: qty 2

## 2017-05-23 MED ORDER — OXYTOCIN BOLUS FROM INFUSION
500.0000 mL | Freq: Once | INTRAVENOUS | Status: DC
Start: 2017-05-23 — End: 2017-05-23

## 2017-05-23 MED ORDER — IBUPROFEN 600 MG PO TABS
600.0000 mg | ORAL_TABLET | Freq: Four times a day (QID) | ORAL | Status: DC
  Administered 2017-05-23 – 2017-05-24 (×5): 600 mg via ORAL
  Filled 2017-05-23 (×5): qty 1

## 2017-05-23 MED ORDER — PHENYLEPHRINE 40 MCG/ML (10ML) SYRINGE FOR IV PUSH (FOR BLOOD PRESSURE SUPPORT)
80.0000 ug | PREFILLED_SYRINGE | INTRAVENOUS | Status: DC | PRN
  Filled 2017-05-23: qty 5

## 2017-05-23 MED ORDER — BETAMETHASONE SOD PHOS & ACET 6 (3-3) MG/ML IJ SUSP
12.0000 mg | INTRAMUSCULAR | Status: DC
  Administered 2017-05-23: 12 mg via INTRAMUSCULAR
  Filled 2017-05-23 (×2): qty 2

## 2017-05-23 MED ORDER — ONDANSETRON HCL 4 MG/2ML IJ SOLN
4.0000 mg | INTRAMUSCULAR | Status: DC | PRN
Start: 2017-05-23 — End: 2017-05-24

## 2017-05-23 MED ORDER — FLEET ENEMA 7-19 GM/118ML RE ENEM: 1.0000 | ENEMA | RECTAL | Status: DC | PRN

## 2017-05-23 MED ORDER — WITCH HAZEL-GLYCERIN EX PADS: 1.0000 "application " | MEDICATED_PAD | CUTANEOUS | Status: DC | PRN

## 2017-05-23 MED ORDER — ONDANSETRON HCL 4 MG/2ML IJ SOLN: 4.0000 mg | Freq: Four times a day (QID) | INTRAMUSCULAR | Status: DC | PRN

## 2017-05-23 MED ORDER — CLONAZEPAM 0.5 MG PO TABS
1.0000 mg | ORAL_TABLET | Freq: Two times a day (BID) | ORAL | Status: DC
  Administered 2017-05-23 – 2017-05-24 (×3): 1 mg via ORAL
  Filled 2017-05-23 (×3): qty 2

## 2017-05-23 MED ORDER — ZOLPIDEM TARTRATE 5 MG PO TABS: 5.0000 mg | ORAL_TABLET | Freq: Every evening | ORAL | Status: DC | PRN

## 2017-05-23 MED ORDER — LIDOCAINE HCL (PF) 1 % IJ SOLN
30.0000 mL | INTRAMUSCULAR | Status: DC | PRN
  Filled 2017-05-23: qty 30

## 2017-05-23 MED ORDER — TETANUS-DIPHTH-ACELL PERTUSSIS 5-2.5-18.5 LF-MCG/0.5 IM SUSP: 0.5000 mL | Freq: Once | INTRAMUSCULAR | Status: DC

## 2017-05-23 MED ORDER — BENZOCAINE-MENTHOL 20-0.5 % EX AERO: 1.0000 "application " | INHALATION_SPRAY | CUTANEOUS | Status: DC | PRN

## 2017-05-23 MED ORDER — ONDANSETRON HCL 4 MG PO TABS: 4.0000 mg | ORAL_TABLET | ORAL | Status: DC | PRN

## 2017-05-23 MED ORDER — PRENATAL MULTIVITAMIN CH
1.0000 | ORAL_TABLET | Freq: Every day | ORAL | Status: DC
  Administered 2017-05-24: 1 via ORAL
  Filled 2017-05-23: qty 1

## 2017-05-23 MED ORDER — ACETAMINOPHEN 325 MG PO TABS
650.0000 mg | ORAL_TABLET | ORAL | Status: DC | PRN
  Administered 2017-05-23: 650 mg via ORAL
  Filled 2017-05-23: qty 2

## 2017-05-23 MED ORDER — PHENYLEPHRINE 40 MCG/ML (10ML) SYRINGE FOR IV PUSH (FOR BLOOD PRESSURE SUPPORT)
80.0000 ug | PREFILLED_SYRINGE | INTRAVENOUS | Status: DC | PRN
Start: 2017-05-23 — End: 2017-05-23
  Filled 2017-05-23: qty 10
  Filled 2017-05-23: qty 5

## 2017-05-23 MED ORDER — OXYTOCIN 40 UNITS IN LACTATED RINGERS INFUSION - SIMPLE MED
2.5000 [IU]/h | INTRAVENOUS | Status: DC
  Administered 2017-05-23: 2.5 [IU]/h via INTRAVENOUS
  Filled 2017-05-23: qty 1000

## 2017-05-23 MED ORDER — FENTANYL 2.5 MCG/ML BUPIVACAINE 1/10 % EPIDURAL INFUSION (WH - ANES)
14.0000 mL/h | INTRAMUSCULAR | Status: DC | PRN
  Administered 2017-05-23 (×2): 14 mL/h via EPIDURAL
  Filled 2017-05-23: qty 100

## 2017-05-23 MED ORDER — PENICILLIN G POT IN DEXTROSE 60000 UNIT/ML IV SOLN
3.0000 10*6.[IU] | INTRAVENOUS | Status: DC
Start: 2017-05-23 — End: 2017-05-23
  Filled 2017-05-23 (×4): qty 50

## 2017-05-23 MED ORDER — LIDOCAINE HCL (PF) 1 % IJ SOLN
INTRAMUSCULAR | Status: DC | PRN
  Administered 2017-05-23 (×2): 5 mL

## 2017-05-23 MED ORDER — DIBUCAINE 1 % RE OINT: 1.0000 "application " | TOPICAL_OINTMENT | RECTAL | Status: DC | PRN

## 2017-05-23 MED ORDER — DIPHENHYDRAMINE HCL 50 MG/ML IJ SOLN: 12.5000 mg | INTRAMUSCULAR | Status: DC | PRN

## 2017-05-23 MED ORDER — OXYCODONE HCL 5 MG PO TABS
5.0000 mg | ORAL_TABLET | Freq: Four times a day (QID) | ORAL | Status: DC | PRN
  Administered 2017-05-23: 5 mg via ORAL
  Filled 2017-05-23 (×2): qty 1

## 2017-05-23 NOTE — Anesthesia Preprocedure Evaluation (Signed)
Anesthesia Evaluation  Patient identified by MRN, date of birth, ID band Patient awake    Reviewed: Allergy & Precautions, NPO status , Patient's Chart, lab work & pertinent test results  Airway Mallampati: II  TM Distance: >3 FB Neck ROM: Full    Dental  (+) Dental Advisory Given   Pulmonary neg pulmonary ROS, former smoker,    Pulmonary exam normal breath sounds clear to auscultation       Cardiovascular negative cardio ROS Normal cardiovascular exam Rhythm:Regular Rate:Normal     Neuro/Psych Anxiety Depression negative neurological ROS     GI/Hepatic negative GI ROS, (+)     substance abuse  , Hx drug use now on methadone therapy   Endo/Other  negative endocrine ROS  Renal/GU negative Renal ROS  negative genitourinary   Musculoskeletal negative musculoskeletal ROS (+)   Abdominal   Peds  Hematology negative hematology ROS (+)   Anesthesia Other Findings   Reproductive/Obstetrics                             Anesthesia Physical Anesthesia Plan  ASA: II  Anesthesia Plan: Epidural   Post-op Pain Management:    Induction:   PONV Risk Score and Plan: Treatment may vary due to age or medical condition  Airway Management Planned: Natural Airway  Additional Equipment: None  Intra-op Plan:   Post-operative Plan:   Informed Consent: I have reviewed the patients History and Physical, chart, labs and discussed the procedure including the risks, benefits and alternatives for the proposed anesthesia with the patient or authorized representative who has indicated his/her understanding and acceptance.     Plan Discussed with:   Anesthesia Plan Comments: (Labs reviewed. Platelets acceptable, patient not taking any blood thinning medications. Risks and benefits discussed with patient, patient expressed understanding and wished to proceed.)        Anesthesia Quick Evaluation

## 2017-05-23 NOTE — H&P (Signed)
LABOR ADMISSION HISTORY AND PHYSICAL  Gina Foley is a 31 y.o. female [phone] with IUP at 1170w6d by US(05/22/17) but patient says LMP was 08/28/16 presenting for PTL. She reports +FMs, No LOF, no VB, no blurry vision, no headaches, no peripheral edema, and no RUQ pain.  She plans on breast feeding. She requests depo for birth control.  Dating: By [place] --->  Estimated Date of Delivery: 07/05/17  Sono:   @33 .5, normal anatomy, cephalic presentation, 2545g, 95%79% EFW  Patient [facility] 5270w[facility]d [facility] Patient has only had one other prenatal visit this pregnancy on 05/18/17.   Prenatal History/Complications: Past drug abuse Gestational HTN (last two visits: 140/99, 142/73  Past Medical History: Past Medical History:  Diagnosis Date  . Anxiety   . Depression   . Gallstones   . Gonorrhea   . Infection    UTI    Past Surgical History: Past Surgical History:  Procedure Laterality Date  . NO PAST SURGERIES    . THERAPEUTIC ABORTION    . WISDOM TOOTH EXTRACTION      Obstetrical History: OB History    Gravida Para Term Preterm AB Living   6 3 3   2 3    SAB TAB Ectopic Multiple Live Births     2     3      Social History: Social History   Social History  . Marital status: Single    Spouse name: N/A  . Number of children: N/A  . Years of education: N/A   Social History Main Topics  . Smoking status: Former Smoker    Packs/day: 0.25    Types: Cigarettes  . Smokeless tobacco: Never Used     Comment: had cut back, stopped a few wks ago  . Alcohol use No  . Drug use: Yes    Types: Marijuana     Comment: Methadone- dly; marijuana 3wks ago (oil)  . Sexual activity: No   Other Topics Concern  . None   Social History Narrative  . None    Family History: Family History  Problem Relation Age of Onset  . Heart disease Maternal Grandmother   . Stroke Maternal Grandmother     Allergies: No Known Allergies  Prescriptions Prior to Admission   Medication Sig Dispense Refill Last Dose  . calcium carbonate (TUMS - DOSED IN MG ELEMENTAL CALCIUM) 500 MG chewable tablet Chew 1-2 tablets by mouth daily.   Taking  . clonazePAM (KLONOPIN) 1 MG tablet Take 1 tab 3 times a day   Taking  . hydrOXYzine (ATARAX/VISTARIL) 50 MG tablet Take 1 tablet (50 mg total) by mouth 3 (three) times daily as needed for anxiety. (Patient not taking: Reported on 05/22/2017) 30 tablet 0 Not Taking  . methadone (DOLOPHINE) 10 MG/ML solution Take 95 mg by mouth daily.   Taking  . nitrofurantoin, macrocrystal-monohydrate, (MACROBID) 100 MG capsule Take 1 capsule (100 mg total) by mouth 2 (two) times daily. (Patient not taking: Reported on 05/18/2017) 14 capsule 0 Not Taking  . ondansetron (ZOFRAN ODT) 8 MG disintegrating tablet Take 1 tablet (8 mg total) by mouth every 8 (eight) hours as needed for nausea or vomiting. (Patient not taking: Reported on 05/18/2017) 20 tablet 0 Not Taking  . Prenatal Vit-Fe Fumarate-FA (MULTIVITAMIN-PRENATAL) 27-0.8 MG TABS tablet Take 1 tablet by mouth daily at 12 noon.   Taking     Review of Systems   All systems reviewed and negative except as stated in HPI  Blood pressure 117/73, pulse 77, temperature 97.9 F (36.6 C), temperature source Oral, resp. rate 18, height  (1.6 m), weight 77.6 kg (171 lb), last menstrual period 08/28/2016, SpO2 98 %. General appearance: alert and cooperative Lungs: normal work of breathing Extremities: Homans sign is negative, no sign of DVT Presentation: cephalic Fetal monitoringBaseline: 110 bpm, Variability: Good {> 6 bpm), Accelerations: Reactive and Decelerations: Early Uterine activityFrequency: Every 2-4 minutes and Duration: 60 seconds Dilation: 5.5 Effacement (%): 70 Station: -2, -1 Exam by:: jolynn   Prenatal labs:  ABO, Rh: --/--/B POS (09/11 1610) Antibody: PENDING (09/11 0735) Rubella: <0.90 RPR: Non Reactive (08/03 1119)  HBsAg: Negative (08/03 1119)  HIV:   non-reactive GBS:    unknown GTT: 1hr. PP :111  Prenatal Transfer Tool  Maternal Diabetes: No Genetic Screening: Declined Maternal Ultrasounds/Referrals: Normal Fetal Ultrasounds or other Referrals:  None Maternal Substance Abuse:  Yes:  Type: Marijuana, Methadone Significant Maternal Medications:  Meds include: Other:  Clonopin Significant Maternal Lab Results: none  Results for orders placed or performed during the hospital encounter of 05/23/17 (from the past 24 hour(s))  CBC   Collection Time: 05/23/17  7:35 AM  Result Value Ref Range   WBC 6.9 4.0 - 10.5 K/uL   RBC 3.97 3.87 - 5.11 MIL/uL   Hemoglobin 11.8 (L) 12.0 - 15.0 g/dL   HCT 96.0 (L) 45.4 - 09.8 %   MCV 85.4 78.0 - 100.0 fL   MCH 29.7 26.0 - 34.0 pg   MCHC 34.8 30.0 - 36.0 g/dL   RDW 11.9 14.7 - 82.9 %   Platelets 205 150 - 400 K/uL  Type and screen [facility]   Collection Time: 05/23/17  7:35 AM  Result Value Ref Range   ABO/RH(D) B POS    Antibody Screen PENDING    Sample Expiration 05/26/2017     Patient Active Problem List   Diagnosis Date Noted  . Normal labor 05/23/2017  . Gestational hypertension 05/18/2017  . Depression affecting pregnancy in third trimester, antepartum 05/18/2017  . Rubella non-immune status, antepartum 05/18/2017  . Pap smear of anus with ASC-H, cannot exclude high-grade lesion 04/26/2017  . Supervision of high risk pregnancy, antepartum 04/14/2017  . Late prenatal care 04/14/2017  . Chronic prescription benzodiazepine use 04/14/2017  . Drug abuse 04/14/2017  . Anxiety 04/14/2017  . Methadone maintenance treatment affecting pregnancy, antepartum (HCC) 04/14/2017    Assessment: Gina Foley is a 31 y.o. F6O1308 at [redacted]w[redacted]d here for SOL  #Labor: expectant management  #Ghtn: continue to monitor BPs #Pain: Epidural upon request  #FWB: Cat 1 #ID: GBS unknown- PCN #MOF: breast #MOC: depo-provera   Anna Genre, PA-S 05/23/2017, 9:08 AM  CNM attestation:  Gina Foley is a 31 y.o. [phone] here for preterm labor @ 33.6 wks by best EDC (scan yesterday)  PE: BP 119/75 (BP Location: Left Arm)   Pulse 72   Temp 97.7 F (36.5 C) (Oral)   Resp 17   Ht  (1.6 m)   Wt 77.6 kg (171 lb)   LMP 08/28/2016   SpO2 99%   Breastfeeding? Unknown   BMI 30.29 kg/m   Resp: normal effort, no distress Abd: gravid  ROS, labs, PMH reviewed  Plan: Admit to [employer] Expectant management Watch BPs- no severe range pressures so far BMZ and PCN Anticipate SVD  Nicola Quesnell CNM 05/23/2017, 12:03 PM

## 2017-05-23 NOTE — Anesthesia Postprocedure Evaluation (Signed)
Anesthesia Post Note  Patient: Gina Foley  Procedure(s) Performed: * No procedures listed *     Patient location during evaluation: Mother Baby Anesthesia Type: Epidural Level of consciousness: awake and alert and oriented Pain management: satisfactory to patient Vital Signs Assessment: post-procedure vital signs reviewed and stable Respiratory status: spontaneous breathing and nonlabored ventilation Cardiovascular status: stable Postop Assessment: no headache, no backache, no signs of nausea or vomiting, adequate PO intake and patient able to bend at knees (patient up walking) Anesthetic complications: no    Last Vitals:  Vitals:   05/23/17 1115 05/23/17 1230  BP: 119/75 126/74  Pulse: 72 74  Resp: 17 16  Temp: 36.5 C 36.7 C  SpO2: 99% 98%    Last Pain:  Vitals:   05/23/17 1230  TempSrc: Oral  PainSc: 3    Pain Goal: Patients Stated Pain Goal: 3 (05/23/17 1230)               Madison HickmanGREGORY,Teyana Pierron

## 2017-05-23 NOTE — Addendum Note (Signed)
Encounter addended by: Heidi Dachobb, Elizabeht Suto A, RN on: 05/23/2017  7:41 AM<BR>    Actions taken: OB Dating navigator section updated

## 2017-05-23 NOTE — Anesthesia Procedure Notes (Signed)
Epidural Patient location during procedure: OB  Staffing Anesthesiologist: Braelin Costlow Performed: anesthesiologist   Preanesthetic Checklist Completed: patient identified, site marked, surgical consent, pre-op evaluation, timeout performed, IV checked, risks and benefits discussed and monitors and equipment checked  Epidural Patient position: sitting Prep: DuraPrep Patient monitoring: heart rate, continuous pulse ox and blood pressure Approach: right paramedian Location: L3-L4 Injection technique: LOR saline  Needle:  Needle type: Tuohy  Needle gauge: 17 G Needle length: 9 cm and 9 Needle insertion depth: 6 cm Catheter type: closed end flexible Catheter size: 20 Guage Catheter at skin depth: 10 cm Test dose: negative  Assessment Events: blood not aspirated, injection not painful, no injection resistance, negative IV test and no paresthesia  Additional Notes Patient identified. Risks/Benefits/Options discussed with patient including but not limited to bleeding, infection, nerve damage, paralysis, failed block, incomplete pain control, headache, blood pressure changes, nausea, vomiting, reactions to medication both or allergic, itching and postpartum back pain. Confirmed with bedside nurse the patient's most recent platelet count. Confirmed with patient that they are not currently taking any anticoagulation, have any bleeding history or any family history of bleeding disorders. Patient expressed understanding and wished to proceed. All questions were answered. Sterile technique was used throughout the entire procedure. Please see nursing notes for vital signs. Test dose was given through epidural needle and negative prior to continuing to dose epidural or start infusion. Warning signs of high block given to the patient including shortness of breath, tingling/numbness in hands, complete motor block, or any concerning symptoms with instructions to call for help. Patient was given  instructions on fall risk and not to get out of bed. All questions and concerns addressed with instructions to call with any issues.     

## 2017-05-23 NOTE — MAU Note (Signed)
Contractions started at 0230, now every .  No bleeding or leaking.  Some BP elevation in pregnancy

## 2017-05-24 ENCOUNTER — Ambulatory Visit: Payer: Self-pay

## 2017-05-24 LAB — CBC
HCT: 30.7 % — ABNORMAL LOW (ref 36.0–46.0)
Hemoglobin: 10.5 g/dL — ABNORMAL LOW (ref 12.0–15.0)
MCH: 29.3 pg (ref 26.0–34.0)
MCHC: 34.2 g/dL (ref 30.0–36.0)
MCV: 85.8 fL (ref 78.0–100.0)
Platelets: 188 10*3/uL (ref 150–400)
RBC: 3.58 MIL/uL — ABNORMAL LOW (ref 3.87–5.11)
RDW: 12.9 % (ref 11.5–15.5)
WBC: 10 10*3/uL (ref 4.0–10.5)

## 2017-05-24 MED ORDER — CITALOPRAM HYDROBROMIDE 20 MG PO TABS: 20.0000 mg | ORAL_TABLET | Freq: Every day | ORAL | 2 refills | Status: DC

## 2017-05-24 MED ORDER — MEASLES, MUMPS & RUBELLA VAC ~~LOC~~ INJ
0.5000 mL | INJECTION | Freq: Once | SUBCUTANEOUS | Status: AC
  Administered 2017-05-24: 0.5 mL via SUBCUTANEOUS
  Filled 2017-05-24: qty 0.5

## 2017-05-24 MED ORDER — IBUPROFEN 600 MG PO TABS: 600.0000 mg | ORAL_TABLET | Freq: Four times a day (QID) | ORAL | 0 refills | Status: DC | PRN

## 2017-05-24 MED ORDER — CLONAZEPAM 1 MG PO TABS: 1.0000 mg | ORAL_TABLET | Freq: Two times a day (BID) | ORAL | 0 refills | Status: DC | PRN

## 2017-05-24 NOTE — Plan of Care (Signed)
Problem: Education: Goal: Knowledge of condition will improve Outcome: Completed/Met Date Met: 05/24/17 Pt's Edinburgh Postnatal Depression Scale was a 15.  Social work consult already ordered d/t pt's history and baby in NICU.  RN talked with Education officer, museum and notified her of the edinburgh score.  Pt will be seen before discharge.  Problem: Nutritional: Goal: Mothers verbalization of comfort with breastfeeding process will improve Outcome: Completed/Met Date Met: 05/24/17 Pt pumping and taking the breastmilk to her baby in NICU.  Pt concerned about supply.  Discussed supply and frequency of pumping.  Pt reports she will be using WIC and does not have a pump at home.  Lactation and the [facility] representatives in the hospital notified.

## 2017-05-24 NOTE — Discharge Summary (Signed)
OB Discharge Summary     Patient Name: Gina Foley DOB: 08-31-1986 MRN: [id number]  Date of admission: 05/23/2017 Delivering MD: Cam Hai D   Date of discharge: 05/24/2017  Admitting diagnosis: 37 WEEKS CTX Intrauterine pregnancy: [redacted]w[redacted]d     Secondary diagnosis:  Principal Problem:   SVD (spontaneous vaginal delivery) Active Problems:   Late prenatal care   Drug abuse   Methadone maintenance treatment affecting pregnancy, antepartum (HCC)   Pap smear of anus with ASC-H, cannot exclude high-grade lesion   Gestational hypertension   Depression affecting pregnancy in third trimester, antepartum   Rubella non-immune status, antepartum  Additional problems: none     Discharge diagnosis: Preterm Pregnancy Delivered                                                                                                Post partum procedures:none  Augmentation: AROM  Complications: None  Hospital course:  Onset of Labor With Vaginal Delivery     31 y.o. yo [id number] at [redacted]w[redacted]d was admitted in Active Labor on 05/23/2017. Patient had an uncomplicated labor course as follows:  Membrane Rupture Time/Date: 9:43 AM ,05/23/2017   Intrapartum Procedures: Episiotomy: None [1]                                         Lacerations:  None [1]  Patient had a delivery of a Viable infant. 05/23/2017  Information for the patient's newborn:  Katerine, Morua [[id number]]  Delivery Method: Vaginal, Spontaneous Delivery (Filed from Delivery Summary)    Pateint had an uncomplicated postpartum course.  She is ambulating, tolerating a regular diet, passing flatus, and urinating well. Patient is discharged home in stable condition on 05/24/17. Eating, drinking, voiding, ambulating well.  +flatus.  Lochia and pain wnl.  Denies dizziness, lightheadedness, or sob. No complaints. Pt desires early d/c, states she has other children she needs to get back to.  All BP's normal here except one shortly after arrival.     Physical exam  Vitals:   05/23/17 1230 05/23/17 1630 05/24/17 0000 05/24/17 0608  BP: 126/74 (!) 115/58 116/66 115/68  Pulse: 74 67 68 73  Resp: Temp: 98.1 F (36.7 C) 98.2 F (36.8 C) 98.5 F (36.9 C) 97.8 F (36.6 C)  TempSrc: Oral Oral Oral Oral  SpO2: 98%     Weight:      Height:       General: alert, cooperative and no distress Lochia: appropriate Uterine Fundus: firm Incision: N/A DVT Evaluation: No evidence of DVT seen on physical exam. Negative Homan's sign. No cords or calf tenderness. No significant calf/ankle edema. Labs: Lab Results  Component Value Date   WBC 10.0 05/24/2017   HGB 10.5 (L) 05/24/2017   HCT 30.7 (L) 05/24/2017   MCV 85.8 05/24/2017   PLT 188 05/24/2017   CMP Latest Ref Rng & Units 04/14/2017  Glucose 65 - 99 mg/dL 284(X)  BUN 6 - 20 mg/dL 7  Creatinine 0.44 - 1.00 mg/dL 1.610.47  Sodium 096135 - 045145 mmol/L 132(L)  Potassium 3.5 - 5.1 mmol/L 3.5  Chloride 101 - 111 mmol/L 101  CO2 22 - 32 mmol/L 22  Calcium 8.9 - 10.3 mg/dL 8.3(L)  Total Protein 6.5 - 8.1 g/dL 6.5  Total Bilirubin 0.3 - 1.2 mg/dL 0.3  Alkaline Phos 38 - 126 U/L 55  AST 15 - 41 U/L 15  ALT 14 - 54 U/L 10(L)    Discharge instruction: per After Visit Summary and "Baby and Me Booklet".  After visit meds:  Allergies as of 05/24/2017   No Known Allergies     Medication List    STOP taking these medications   calcium carbonate 500 MG chewable tablet Commonly known as:  TUMS - dosed in mg elemental calcium   nitrofurantoin (macrocrystal-monohydrate) 100 MG capsule Commonly known as:  MACROBID   ondansetron 8 MG disintegrating tablet Commonly known as:  ZOFRAN ODT     TAKE these medications   clonazePAM 1 MG tablet Commonly known as:  KLONOPIN Take 1 tab 3 times a day   hydrOXYzine 50 MG tablet Commonly known as:  ATARAX/VISTARIL Take 1 tablet (50 mg total) by mouth 3 (three) times daily as needed for anxiety.   ibuprofen 600 MG tablet Commonly  known as:  ADVIL,MOTRIN Take 1 tablet (600 mg total) by mouth every 6 (six) hours as needed for mild pain, moderate pain or cramping.   methadone 10 MG/ML solution Commonly known as:  DOLOPHINE Take 100 mg by mouth daily.   multivitamin-prenatal 27-0.8 MG Tabs tablet Take 1 tablet by mouth daily at 12 noon.            Discharge Care Instructions        Start     Ordered   05/24/17 0000  ibuprofen (ADVIL,MOTRIN) 600 MG tablet  Every 6 hours PRN    Question:  Supervising Provider  Answer:  Lazaro ArmsURE, LUTHER H   05/24/17 [id number]      Diet: routine diet  Activity: Advance as tolerated. Pelvic rest for 6 weeks.   Outpatient follow up:1wk baby love visit for bp check, then 4wk visit for pp vist Follow up Appt:No future appointments. Follow up Visit:No Follow-up on file.  Postpartum contraception: abstinence until depo  Newborn Data: Live born female  Birth Weight: 5 lb 13.1 oz (2640 g) APGAR: 6, 8  Baby Feeding: Breast Disposition:NICU Pt to be d/c'd per her request after SW visit  05/24/2017 Marge DuncansBooker, Kashira Behunin Randall, CNM

## 2017-05-24 NOTE — Discharge Instructions (Signed)
Postpartum Care After Vaginal Delivery °The period of time right after you deliver your newborn is called the postpartum period. °What kind of medical care will I receive? °· You may continue to receive fluids and medicines through an IV tube inserted into one of your veins. °· If an incision was made near your vagina (episiotomy) or if you had some vaginal tearing during delivery, cold compresses may be placed on your episiotomy or your tear. This helps to reduce pain and swelling. °· You may be given a squirt bottle to use when you go to the bathroom. You may use this until you are comfortable wiping as usual. To use the squirt bottle, follow these steps: °? Before you urinate, fill the squirt bottle with warm water. Do not use hot water. °? After you urinate, while you are sitting on the toilet, use the squirt bottle to rinse the area around your urethra and vaginal opening. This rinses away any urine and blood. °? You may do this instead of wiping. As you start healing, you may use the squirt bottle before wiping yourself. Make sure to wipe gently. °? Fill the squirt bottle with clean water every time you use the bathroom. °· You will be given sanitary pads to wear. °How can I expect to feel? °· You may not feel the need to urinate for several hours after delivery. °· You will have some soreness and pain in your abdomen and vagina. °· If you are breastfeeding, you may have uterine contractions every time you breastfeed for up to several weeks postpartum. Uterine contractions help your uterus return to its normal size. °· It is normal to have vaginal bleeding (lochia) after delivery. The amount and appearance of lochia is often similar to a menstrual period in the first week after delivery. It will gradually decrease over the next few weeks to a dry, yellow-brown discharge. For most women, lochia stops completely by 6-8 weeks after delivery. Vaginal bleeding can vary from woman to woman. °· Within the first few  days after delivery, you may have breast engorgement. This is when your breasts feel heavy, full, and uncomfortable. Your breasts may also throb and feel hard, tightly stretched, warm, and tender. After this occurs, you may have milk leaking from your breasts. Your health care provider can help you relieve discomfort due to breast engorgement. Breast engorgement should go away within a few days. °· You may feel more sad or worried than normal due to hormonal changes after delivery. These feelings should not last more than a few days. If these feelings do not go away after several days, speak with your health care provider. °How should I care for myself? °· Tell your health care provider if you have pain or discomfort. °· Drink enough water to keep your urine clear or pale yellow. °· Wash your hands thoroughly with soap and water for at least 20 seconds after changing your sanitary pads, after using the toilet, and before holding or feeding your baby. °· If you are not breastfeeding, avoid touching your breasts a lot. Doing this can make your breasts produce more milk. °· If you become weak or lightheaded, or you feel like you might faint, ask for help before: °? Getting out of bed. °? Showering. °· Change your sanitary pads frequently. Watch for any changes in your flow, such as a sudden increase in volume, a change in color, the passing of large blood clots. If you pass a blood clot from your vagina, save it   to show to your health care provider. Do not flush blood clots down the toilet without having your health care provider look at them. °· Make sure that all your vaccinations are up to date. This can help protect you and your baby from getting certain diseases. You may need to have immunizations done before you leave the hospital. °· If desired, talk with your health care provider about methods of family planning or birth control (contraception). °How can I start bonding with my baby? °Spending as much time as  possible with your baby is very important. During this time, you and your baby can get to know each other and develop a bond. Having your baby stay with you in your room (rooming in) can give you time to get to know your baby. Rooming in can also help you become comfortable caring for your baby. Breastfeeding can also help you bond with your baby. °How can I plan for returning home with my baby? °· Make sure that you have a car seat installed in your vehicle. °? Your car seat should be checked by a certified car seat installer to make sure that it is installed safely. °? Make sure that your baby fits into the car seat safely. °· Ask your health care provider any questions you have about caring for yourself or your baby. Make sure that you are able to contact your health care provider with any questions after leaving the hospital. °This information is not intended to replace advice given to you by your health care provider. Make sure you discuss any questions you have with your health care provider. °Document Released: 06/26/2007 Document Revised: 02/01/2016 Document Reviewed: 08/03/2015 °Elsevier Interactive Patient Education © 2018 [employer] ° °Breastfeeding °Deciding to breastfeed is one of the best choices you can make for you and your baby. A change in hormones during pregnancy causes your breast tissue to grow and increases the number and size of your milk ducts. These hormones also allow proteins, sugars, and fats from your blood supply to make breast milk in your milk-producing glands. Hormones prevent breast milk from being released before your baby is born as well as prompt milk flow after birth. Once breastfeeding has begun, thoughts of your baby, as well as his or her sucking or crying, can stimulate the release of milk from your milk-producing glands. °Benefits of breastfeeding °For Your Baby °· Your first milk (colostrum) helps your baby's digestive system function better. °· There are antibodies in  your milk that help your baby fight off infections. °· Your baby has a lower incidence of asthma, allergies, and sudden infant death syndrome. °· The nutrients in breast milk are better for your baby than infant formulas and are designed uniquely for your baby’s needs. °· Breast milk improves your baby's brain development. °· Your baby is less likely to develop other conditions, such as childhood obesity, asthma, or type 2 diabetes mellitus. ° °For You °· Breastfeeding helps to create a very special bond between you and your baby. °· Breastfeeding is convenient. Breast milk is always available at the correct temperature and costs nothing. °· Breastfeeding helps to burn calories and helps you lose the weight gained during pregnancy. °· Breastfeeding makes your uterus contract to its prepregnancy size faster and slows bleeding (lochia) after you give birth. °· Breastfeeding helps to lower your risk of developing type 2 diabetes mellitus, osteoporosis, and breast or ovarian cancer later in life. ° °Signs that your baby is hungry °Early Signs of   Hunger °· Increased alertness or activity. °· Stretching. °· Movement of the head from side to side. °· Movement of the head and opening of the mouth when the corner of the mouth or cheek is stroked (rooting). °· Increased sucking sounds, smacking lips, cooing, sighing, or squeaking. °· Hand-to-mouth movements. °· Increased sucking of fingers or hands. ° °Late Signs of Hunger °· Fussing. °· Intermittent crying. ° °Extreme Signs of Hunger °Signs of extreme hunger will require calming and consoling before your baby will be able to breastfeed successfully. Do not wait for the following signs of extreme hunger to occur before you initiate breastfeeding: °· Restlessness. °· A loud, strong cry. °· Screaming. ° °Breastfeeding basics °Breastfeeding Initiation °· Find a comfortable place to sit or lie down, with your neck and back well supported. °· Place a pillow or rolled up blanket  under your baby to bring him or her to the level of your breast (if you are seated). Nursing pillows are specially designed to help support your arms and your baby while you breastfeed. °· Make sure that your baby's abdomen is facing your abdomen. °· Gently massage your breast. With your fingertips, massage from your chest wall toward your nipple in a circular motion. This encourages milk flow. You may need to continue this action during the feeding if your milk flows slowly. °· Support your breast with 4 fingers underneath and your thumb above your nipple. Make sure your fingers are well away from your nipple and your baby’s mouth. °· Stroke your baby's lips gently with your finger or nipple. °· When your baby's mouth is open wide enough, quickly bring your baby to your breast, placing your entire nipple and as much of the colored area around your nipple (areola) as possible into your baby's mouth. °? More areola should be visible above your baby's upper lip than below the lower lip. °? Your baby's tongue should be between his or her lower gum and your breast. °· Ensure that your baby's mouth is correctly positioned around your nipple (latched). Your baby's lips should create a seal on your breast and be turned out (everted). °· It is common for your baby to suck about 2-3 minutes in order to start the flow of breast milk. ° °Latching °Teaching your baby how to latch on to your breast properly is very important. An improper latch can cause nipple pain and decreased milk supply for you and poor weight gain in your baby. Also, if your baby is not latched onto your nipple properly, he or she may swallow some air during feeding. This can make your baby fussy. Burping your baby when you switch breasts during the feeding can help to get rid of the air. However, teaching your baby to latch on properly is still the best way to prevent fussiness from swallowing air while breastfeeding. °Signs that your baby has successfully  latched on to your nipple: °· Silent tugging or silent sucking, without causing you pain. °· Swallowing heard between every 3-4 sucks. °· Muscle movement above and in front of his or her ears while sucking. ° °Signs that your baby has not successfully latched on to nipple: °· Sucking sounds or smacking sounds from your baby while breastfeeding. °· Nipple pain. ° °If you think your baby has not latched on correctly, slip your finger into the corner of your baby’s mouth to break the suction and place it between your baby's gums. Attempt breastfeeding initiation again. °Signs of Successful Breastfeeding °Signs from your   baby: °· A gradual decrease in the number of sucks or complete cessation of sucking. °· Falling asleep. °· Relaxation of his or her body. °· Retention of a small amount of milk in his or her mouth. °· Letting go of your breast by himself or herself. ° °Signs from you: °· Breasts that have increased in firmness, weight, and size 1-3 hours after feeding. °· Breasts that are softer immediately after breastfeeding. °· Increased milk volume, as well as a change in milk consistency and color by the fifth day of breastfeeding. °· Nipples that are not sore, cracked, or bleeding. ° °Signs That Your Baby is Getting Enough Milk °· Wetting at least 1-2 diapers during the first 24 hours after birth. °· Wetting at least 5-6 diapers every 24 hours for the first week after birth. The urine should be clear or pale yellow by 5 days after birth. °· Wetting 6-8 diapers every 24 hours as your baby continues to grow and develop. °· At least 3 stools in a 24-hour period by age 5 days. The stool should be soft and yellow. °· At least 3 stools in a 24-hour period by age 7 days. The stool should be seedy and yellow. °· No loss of weight greater than 10% of birth weight during the first 3 days of age. °· Average weight gain of 4-7 ounces (113-198 g) per week after age 4 days. °· Consistent daily weight gain by age 5 days, without  weight loss after the age of 2 weeks. ° °After a feeding, your baby may spit up a small amount. This is common. °Breastfeeding frequency and duration °Frequent feeding will help you make more milk and can prevent sore nipples and breast engorgement. Breastfeed when you feel the need to reduce the fullness of your breasts or when your baby shows signs of hunger. This is called "breastfeeding on demand." Avoid introducing a pacifier to your baby while you are working to establish breastfeeding (the first 4-6 weeks after your baby is born). After this time you may choose to use a pacifier. Research has shown that pacifier use during the first year of a baby's life decreases the risk of sudden infant death syndrome (SIDS). °Allow your baby to feed on each breast as long as he or she wants. Breastfeed until your baby is finished feeding. When your baby unlatches or falls asleep while feeding from the first breast, offer the second breast. Because newborns are often sleepy in the first few weeks of life, you may need to awaken your baby to get him or her to feed. °Breastfeeding times will vary from baby to baby. However, the following rules can serve as a guide to help you ensure that your baby is properly fed: °· Newborns (babies 4 weeks of age or younger) may breastfeed every 1-3 hours. °· Newborns should not go longer than 3 hours during the day or 5 hours during the night without breastfeeding. °· You should breastfeed your baby a minimum of 8 times in a 24-hour period until you begin to introduce solid foods to your baby at around 6 months of age. ° °Breast milk pumping °Pumping and storing breast milk allows you to ensure that your baby is exclusively fed your breast milk, even at times when you are unable to breastfeed. This is especially important if you are going back to work while you are still breastfeeding or when you are not able to be present during feedings. Your lactation consultant can give you guidelines    on how long it is safe to store breast milk. °A breast pump is a machine that allows you to pump milk from your breast into a sterile bottle. The pumped breast milk can then be stored in a refrigerator or freezer. Some breast pumps are operated by hand, while others use electricity. Ask your lactation consultant which type will work best for you. Breast pumps can be purchased, but some hospitals and breastfeeding support groups lease breast pumps on a monthly basis. A lactation consultant can teach you how to hand express breast milk, if you prefer not to use a pump. °Caring for your breasts while you breastfeed °Nipples can become dry, cracked, and sore while breastfeeding. The following recommendations can help keep your breasts moisturized and healthy: °· Avoid using soap on your nipples. °· Wear a supportive bra. Although not required, special nursing bras and tank tops are designed to allow access to your breasts for breastfeeding without taking off your entire bra or top. Avoid wearing underwire-style bras or extremely tight bras. °· Air dry your nipples for 3-4 minutes after each feeding. °· Use only cotton bra pads to absorb leaked breast milk. Leaking of breast milk between feedings is normal. °· Use lanolin on your nipples after breastfeeding. Lanolin helps to maintain your skin's normal moisture barrier. If you use pure lanolin, you do not need to wash it off before feeding your baby again. Pure lanolin is not toxic to your baby. You may also hand express a few drops of breast milk and gently massage that milk into your nipples and allow the milk to air dry. ° °In the first few weeks after giving birth, some women experience extremely full breasts (engorgement). Engorgement can make your breasts feel heavy, warm, and tender to the touch. Engorgement peaks within 3-5 days after you give birth. The following recommendations can help ease engorgement: °· Completely empty your breasts while breastfeeding or  pumping. You may want to start by applying warm, moist heat (in the shower or with warm water-soaked hand towels) just before feeding or pumping. This increases circulation and helps the milk flow. If your baby does not completely empty your breasts while breastfeeding, pump any extra milk after he or she is finished. °· Wear a snug bra (nursing or regular) or tank top for 1-2 days to signal your body to slightly decrease milk production. °· Apply ice packs to your breasts, unless this is too uncomfortable for you. °· Make sure that your baby is latched on and positioned properly while breastfeeding. ° °If engorgement persists after 48 hours of following these recommendations, contact your health care provider or a lactation consultant. °Overall health care recommendations while breastfeeding °· Eat healthy foods. Alternate between meals and snacks, eating 3 of each per day. Because what you eat affects your breast milk, some of the foods may make your baby more irritable than usual. Avoid eating these foods if you are sure that they are negatively affecting your baby. °· Drink milk, fruit juice, and water to satisfy your thirst (about 10 glasses a day). °· Rest often, relax, and continue to take your prenatal vitamins to prevent fatigue, stress, and anemia. °· Continue breast self-awareness checks. °· Avoid chewing and smoking tobacco. Chemicals from cigarettes that pass into breast milk and exposure to secondhand smoke may harm your baby. °· Avoid alcohol and drug use, including marijuana. °Some medicines that may be harmful to your baby can pass through breast milk. It is important to ask your health   care provider before taking any medicine, including all over-the-counter and prescription medicine as well as vitamin and herbal supplements. °It is possible to become pregnant while breastfeeding. If birth control is desired, ask your health care provider about options that will be safe for your baby. °Contact a  health care provider if: °· You feel like you want to stop breastfeeding or have become frustrated with breastfeeding. °· You have painful breasts or nipples. °· Your nipples are cracked or bleeding. °· Your breasts are red, tender, or warm. °· You have a swollen area on either breast. °· You have a fever or chills. °· You have nausea or vomiting. °· You have drainage other than breast milk from your nipples. °· Your breasts do not become full before feedings by the fifth day after you give birth. °· You feel sad and depressed. °· Your baby is too sleepy to eat well. °· Your baby is having trouble sleeping. °· Your baby is wetting less than 3 diapers in a 24-hour period. °· Your baby has less than 3 stools in a 24-hour period. °· Your baby's skin or the white part of his or her eyes becomes yellow. °· Your baby is not gaining weight by 5 days of age. °Get help right away if: °· Your baby is overly tired (lethargic) and does not want to wake up and feed. °· Your baby develops an unexplained fever. °This information is not intended to replace advice given to you by your health care provider. Make sure you discuss any questions you have with your health care provider. °Document Released: 08/29/2005 Document Revised: 02/10/2016 Document Reviewed: 02/20/2013 °Elsevier Interactive Patient Education © 2017 [employer] ° °

## 2017-05-24 NOTE — Progress Notes (Signed)
Update Note  Patient seen by SW prior to discharge. She has significant anxiety, and baby is in NICU. She is on clonazepam for anxiety, but is out of medication and reports not having a psychiatry appt for a month. Spoke with patient. Will start SSRI, citalopram. Told patient we can prescribe a max of #5 for clonazepam to bridge her until she sees psychiatry. I have messaged our clinic to set up a 2-week PPV to screen for postpartum depression.   Raynelle FanningJulie P. Karthikeya Funke, MD OB Fellow

## 2017-05-24 NOTE — Lactation Note (Signed)
This note was copied from a baby's chart. Lactation Consultation Note  Patient Name: Girl Roselie AwkwardSusan Markwardt UJWJX'BToday's Date: 05/24/2017 Reason for consult: Initial assessment;NICU baby;Pump rental  NICU baby 30 hours old. Mom states that she has been in NICU most of the morning. [facility]. Mom states that she has been pumping, but is not seeing very much colostrum. Discussed progression of milk coming to volume and enc mom to pump every 2-3 hours for a total of 8-12 times/24 hours followed by hand expression. Mom aware of pumping rooms in the NICU, OP/BFSG and LC phone line assistance after D/C. Removed tubing and membranes from bedside pump and placed in bag on cart with mom's belongings. Discussed EBM storage guidelines, and how to transport milk to hospital. Enc mom to call for assistance as needed.   Maternal Data Has patient been taught Hand Expression?: Yes Does the patient have breastfeeding experience prior to this delivery?: No  Feeding Feeding Type: Formula Length of feed: 45 min  LATCH Score                   Interventions Interventions: DEBP  Lactation Tools Discussed/Used Tools: Pump Breast pump type: Double-Electric Breast Pump WIC Program: Yes Pump Review: Setup, frequency, and cleaning;Milk Storage Initiated by:: bedside RN Date initiated:: 05/23/17   Consult Status Consult Status: Complete    Sherlyn HayJennifer D Whittany Parish 05/24/2017, 3:58 PM

## 2017-05-24 NOTE — Clinical Social Work Maternal (Signed)
CLINICAL SOCIAL WORK MATERNAL/CHILD NOTE  Patient Details  Name: Gina Foley MRN: [id number] Date of Birth: 09/23/1985  Date:  05/24/2017  Clinical Social Worker Initiating Note:  Valery Chance, LCSW Date/ Time Initiated:  05/24/17/1035     Child's Name:  Lillian Grace Puchalski   Legal Guardian:  Other (Comment) (Parents: Yamaris "Michelle" Blumstein and Ali Mughil)   Need for Interpreter:  None   Date of Referral:  05/24/17     Reason for Referral:  Adoption , Behavioral Health Issues, including SI , Current Substance Use/Substance Use During Pregnancy    Referral Source:  Central Nursery   Address:  [address] [place], [place] [address]  Phone number:  [phone] (MOB's phone is currently text only: [phone])   Household Members:  Minor Children, Parents, Relatives (MOB lives with her mother/Pamela Engebretsen, 14 year old neice and three children: Gabriel Theriault (son-04/17/08), Zackary Mitten (son-07/03/12), and Sarah (daughter-12/03/15).)   Natural Supports (not living in the home):  Friends, Other (Comment) (MOB reports that FOB is involved and supportive, though they are not currently in a relationship.  )   Professional Supports: Therapist, Other (Comment) (MOB sees Dr. Love for medication managment in [place].  She plans to start therapy in his office.  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  MOB was pleasant and welcoming of CSW's visit and states this is a good time to talk with her.  CSW spend nearly 2 hours with MOB as she spoke about her current social situation and processed her emotions surrounding this pregnancy and baby's birth. CSW inquired about documentation that MOB was planning an adoption during pregnancy.  MOB states she has been working with [facility], but is not sure whether or not she plans to go through with adoption or if she plans to parent infant.  She has met an adoptive family that she states she likes very much, but is feeling extremely emotional about this decision.  CSW provided supportive brief counseling as she spoke at length about how difficult this decision is.  CSW explained that there will be emotions attached to either decision (parenting or adoption) and recommends ongoing counseling to assist her in coping with emotions.  She was very tearful as we spoke. MOB states she initially sought out adoption because she was overwhelmed, mainly financially, with the thought of having 4 children.  MOB states that she has 3 children at home, the last two of whom have the same father as this baby.  She states FOB left to go home to [place] in   July and got married.  MOB reports this was "an arranged marriage."  She reports that he is back and although he has stated that he will support MOB's decision if she wants to go through with adoption, he has also promised to continue to support MOB and their children financially.  CSW inquired about his reliability and financial support in the past.  MOB states he has always taken care of them, but then spoke about how she had to rely on the adoption  agency to pay for her Methadone and rent while FOB was out of the country because providing financial assistance like he usually did.  CSW asked how MOB can be certain that FOB is staying or that he will remain supportive now that he has a wife in [place].  MOB seems unconcerned about this.  She states she needs more time to make up her mind about whether she feels she is prepared to parent another child or not. MOB reports that she has all necessary supplies for infant in the event that she takes baby home.  She is aware of SIDS precautions and was open to discussing her mental health hx.  MOB reports hx of Anxiety, Depression and PTSD.  MOB states she takes Klonopin prescribed to her by Dr. Love at [facility].  She reports that she has been going here for 6-7 years.  She states she has PTSD from things she went through as a child.  MOB was engaged and attentive as CSW discussed PMADs and the importance of monitoring emotions throughout the postpartum time especially.  MOB was fixated on the fact that she does not have a refill for her Klonopin medication and states it ran out on 05/12/17.  She states she has an appointment scheduled in October and that she has attempted to contact the office without success.  CSW offered to call the office to attempt to resolve the issues with her, but she declined need for assistance.  She asked if OB would prescribe medication until she is seen again in October.  CSW stated that she can talk with her OB about this, but suggests that she needs to contact her psychiatrist, as this is the person who is not only the mental health specialist, but also the doctor who has already been ordering this medication for her.  Based on MOB's high emotionality, hx of mental illness, and current postpartum status, CSW recommends outpatient counseling and the possibility of starting an antidepressant, explaining that this is a non-habit forming medication that also  treats anxiety.  CSW explained that an OB may be more likely to start a patient on this type of medication and CSW thinks MOB could benefit from this type of medication.  MOB agrees that she would like to start an antidepressant, but states she has tried Zoloft and Prozac in the past and does not wish to take either of these medications.  CSW suggests Celexa, as it is on the $4 prescription list at [facility] in the event that MOB does not have Medicaid at some point.  MOB agrees.  When RN came into room, CSW asked if MD could be notified that MOB would like to discuss mental health medications prior to discharge.  MD arrived and this was discussed.  It was decided that 5 Klonopin would be ordered and that an Rx for Celexa would be written as well.  CSW offered to make a counseling appointment for MOB and she states she can   do this for herself.  CSW encouraged her to follow through.   CSW inquired about substance use hx.  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  MOB reports that she was on 15mg when she got pregnant with her 17 month old daughter and had to increase her dose to 40mg.  She was still taking 40mg when she got pregnant with this baby and is now at a dose of 100mg.  She would like to be able to wean off completely.  CSW informed MOB of positive UDS for Benzodiazepines and Marijuana and mandatory report to Child Protective Services for Marijuana.  MOB was nervous and stated that this would be "added stress."  CSW asked her to look at this as potential support for her and not as punishment.  CSW explained that a UDS was not sent on baby, but since she was positive on admission, CSW feels it is necessary to make report.  CSW explained that CDS is pending.  MOB reports "vaping cannabis oil" for nausea during pregnancy, but was unclear as to frequency of use.  She estimates that her last use was 3 weeks to a month ago.  She was  understanding about reason for drug screen and report to CPS.  MOB explained to CSW that she had [facility] because she was "waiting on Medicaid."  CSW asked what services she was receiving, if any, during pregnancy in addition to Methadone treatment.  MOB reports no other services.  CSW asked if she felt additional services would have been helpful in supporting her recovery and she states she feels she had all the support she needed and could not identify any additional needs.  If MOB chooses to parent, a CC4C referral will be made for added support. CSW will continue to follow closely and is available for support as needed/desired by MOB.  MOB asked CSW to notify NICU staff that she may not visit much initially until she makes up her mind about her plan so not to bond if she goes through with adoption.  CSW told her to take the time and space she feels she needs and asked her to stay in touch with CSW.  MOB states CSW can call her on her mother's number, but that her phone can only text.  CSW explained that CSW will not converse via text message, but can ask MOB to call with a text message.  MOB states she will find a phone to use if she receives a text from CSW requesting a call.  She asked to be kept up to date regarding CPS involvement and CSW agrees.  CSW Plan/Description:  Child Protective Service Report , Patient/Family Education , Psychosocial Support and Ongoing Assessment of Needs    Gabrielle Wakeland Elizabeth, LCSW 05/24/2017, 1:06 PM  

## 2017-05-25 ENCOUNTER — Encounter: Payer: Self-pay | Admitting: Advanced Practice Midwife

## 2017-05-29 ENCOUNTER — Ambulatory Visit (HOSPITAL_COMMUNITY): Payer: Self-pay

## 2017-06-05 ENCOUNTER — Ambulatory Visit (HOSPITAL_COMMUNITY): Payer: Self-pay

## 2017-06-07 ENCOUNTER — Ambulatory Visit: Payer: Self-pay | Admitting: Medical

## 2017-06-12 ENCOUNTER — Ambulatory Visit (HOSPITAL_COMMUNITY): Payer: Self-pay

## 2017-07-04 ENCOUNTER — Ambulatory Visit: Payer: Self-pay | Admitting: Medical

## 2018-12-19 ENCOUNTER — Encounter: Payer: Self-pay | Admitting: *Deleted

## 2019-01-05 IMAGING — US US MFM FETAL BPP W/O NON-STRESS
1 series · 13 of 28 positions shown · non-contrast
Comparison: none

[Series 1: us mfm fetal bpp w/o non-stress · 118 acquisitions, 13 frames shown]
[im 5/118]
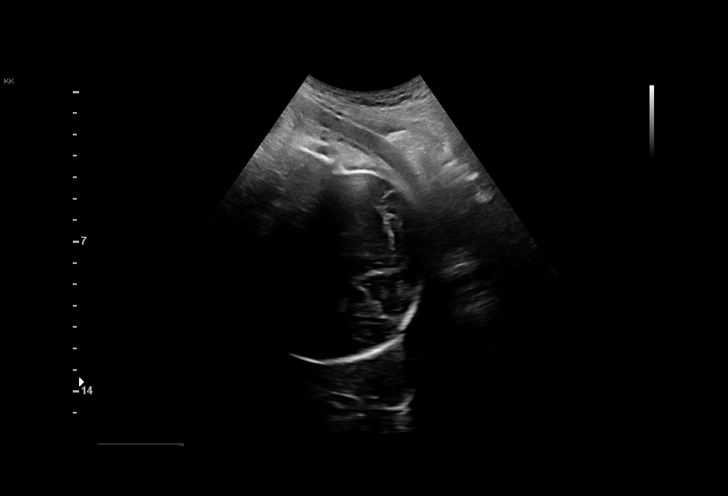
[im 14/118]
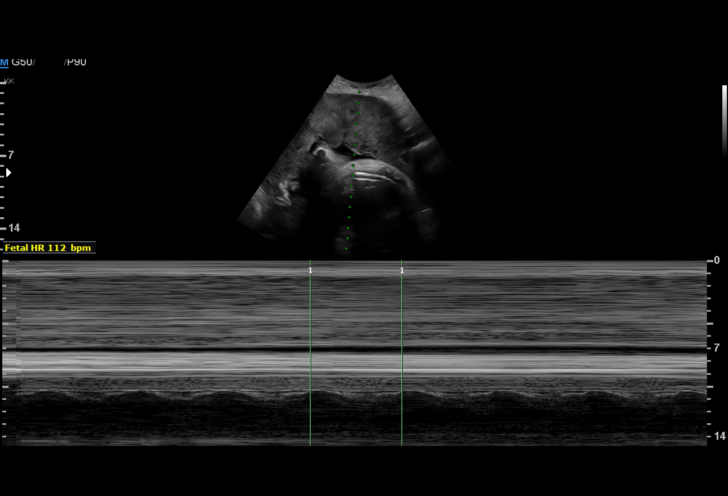
[im 22/118]
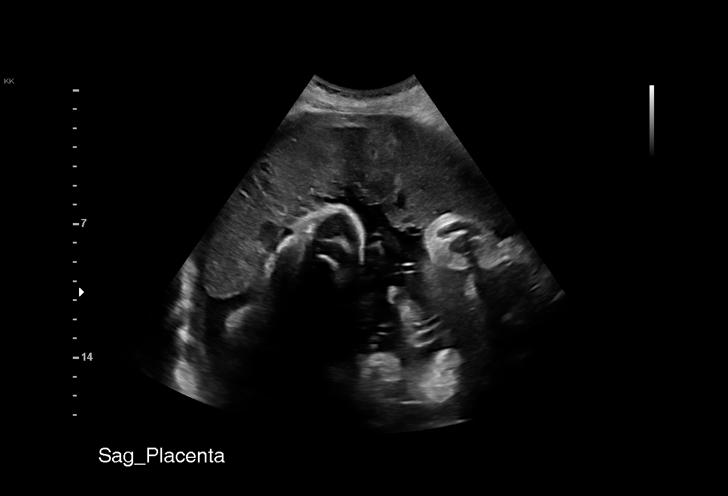
[im 31/118]
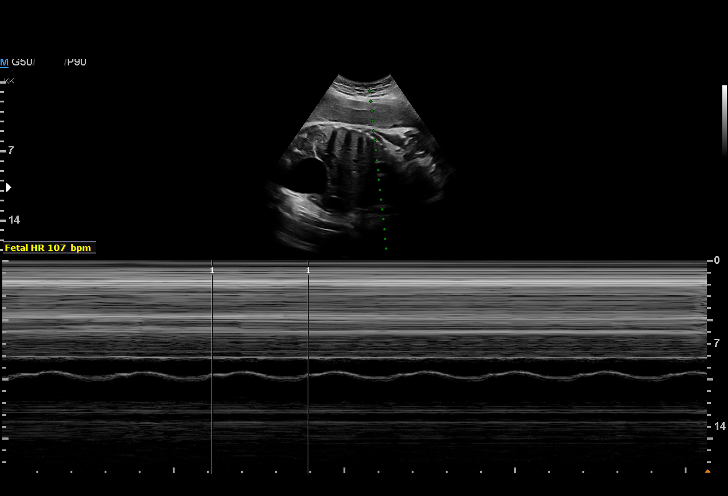
[im 40/118]
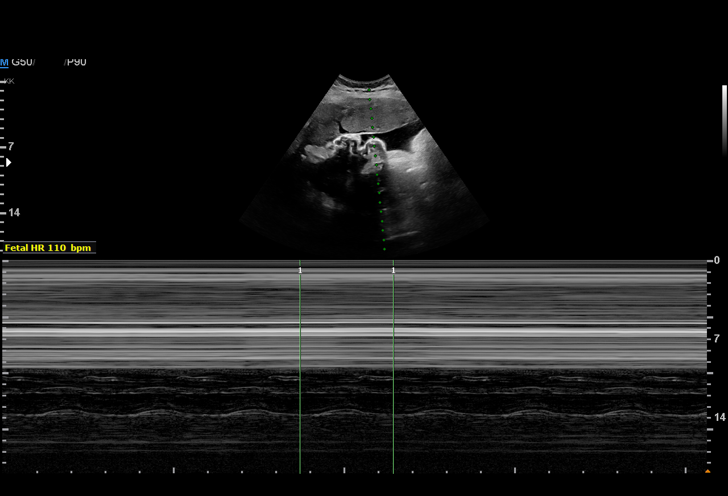
[im 48/118]
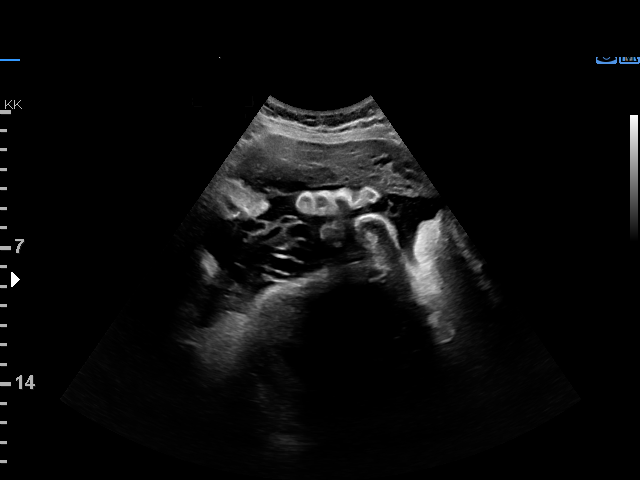
[im 61/118]
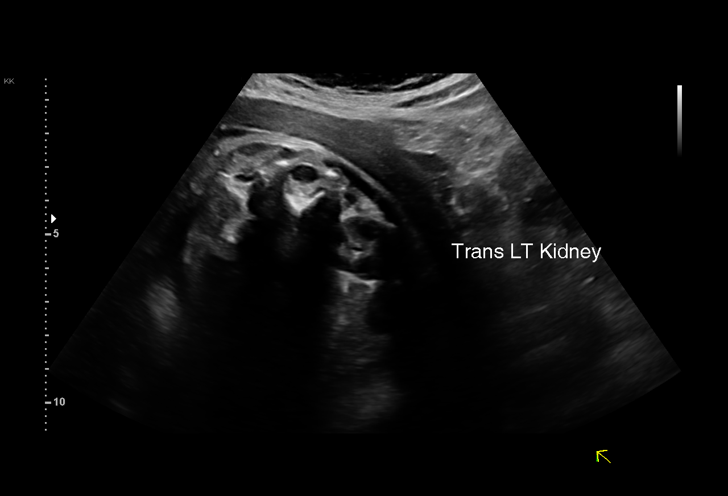
[im 70/118]
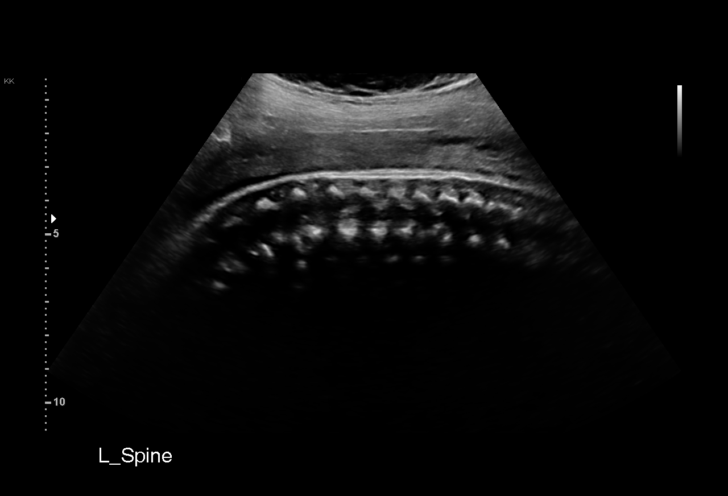
[im 79/118]
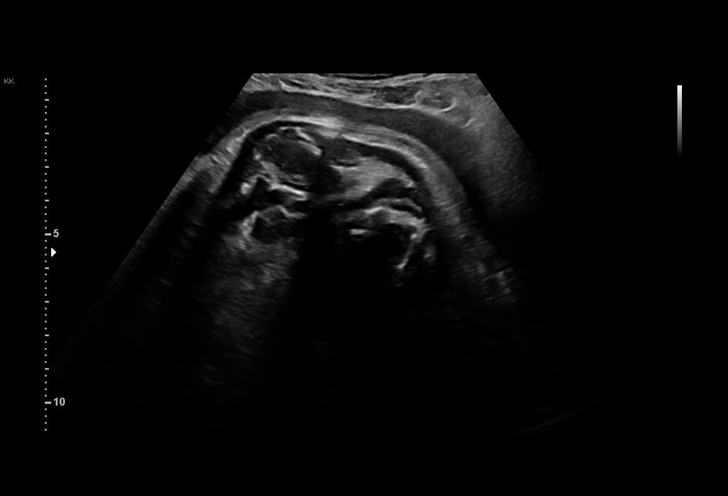
[im 87/118]
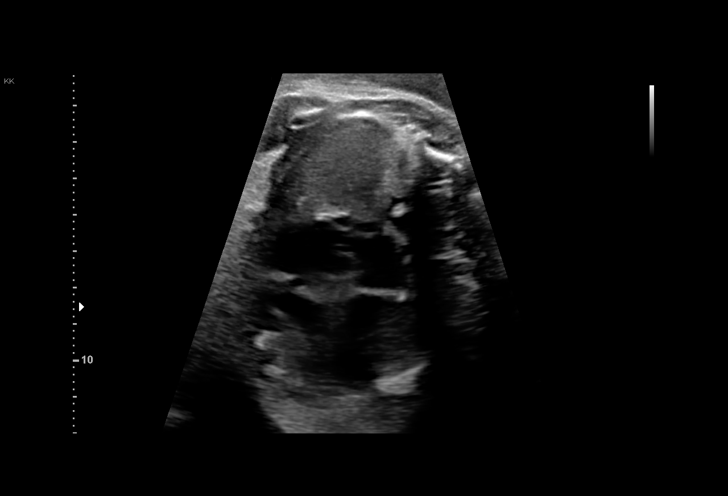
[im 96/118]
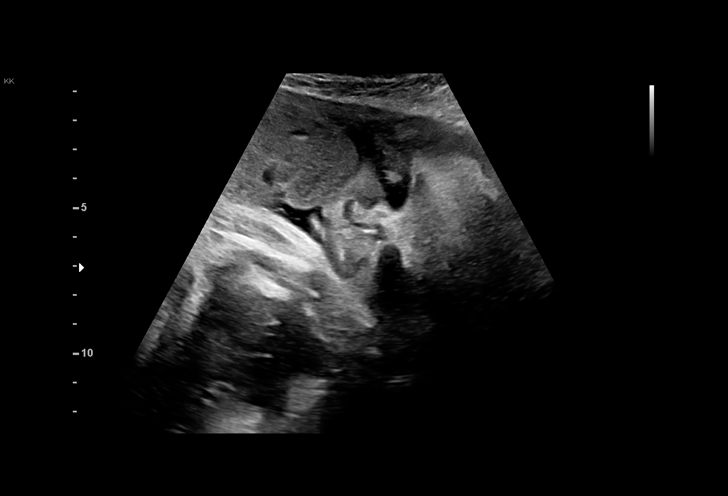
[im 105/118]
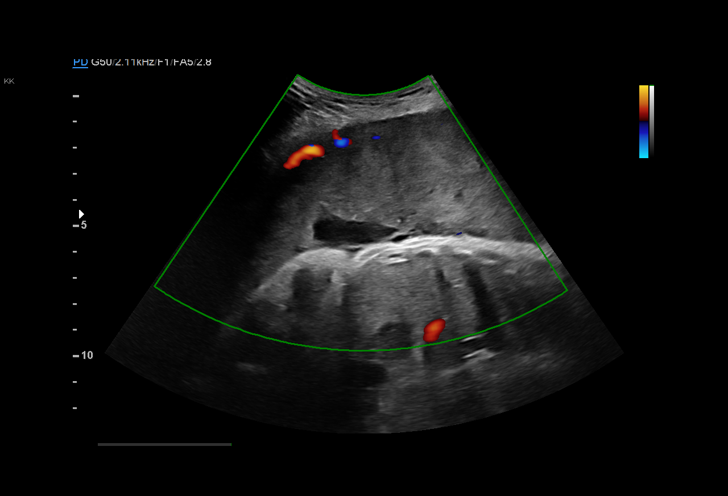
[im 113/118]
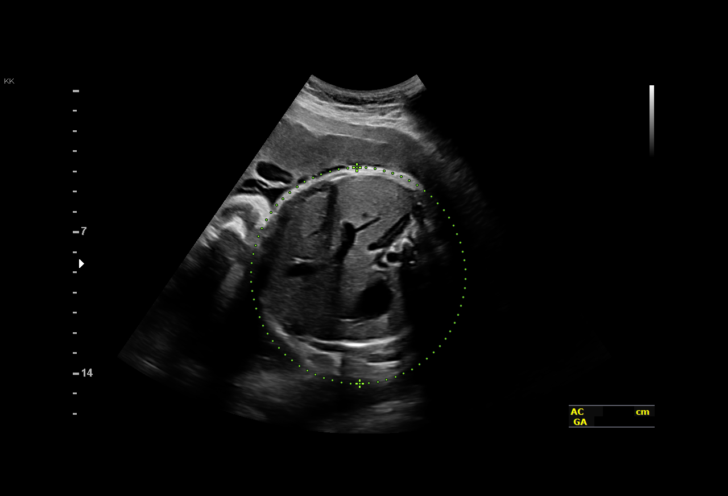

[13 of 28 positions shown; findings below may reference images not displayed]

pm)

[REDACTED]

1  GOLAUP SAKHAWOTH             [id number]      [id number]     [id number]
2  GOLAUP SAKHAWOTH             [id number]      [id number]     [id number]
Indications

33 weeks gestation of pregnancy
Encounter for antenatal screening for
malformations
Encounter for uncertain dates
Late to prenatal care, third trimester
Drug use complicating pregnancy, third
trimester
Tobacco use complicating pregnancy, third
trimester
Abnormality fetal heart rate or rhythm, 3rd
trimester (bradycardia)
OB History

Blood Type:            Height:  5'3"   Weight (lb):  171       BMI:
Gravidity:    5         Term:   3        Prem:   0        SAB:   1
TOP:          0       Ectopic:  0        Living: 3
Fetal Evaluation

Num Of Fetuses:     1
Fetal Heart         116
Rate(bpm):
Cardiac Activity:   Observed
Presentation:       Cephalic
Placenta:           Anterior, above cervical os
P. Cord Insertion:  Visualized
Amniotic Fluid
AFI FV:      Subjectively within normal limits

AFI Sum(cm)     %Tile       Largest Pocket(cm)
8.09            5

RUQ(cm)       RLQ(cm)       LUQ(cm)        LLQ(cm)
3.27
Biophysical Evaluation

Amniotic F.V:   Pocket => 2 cm two         F. Tone:        Observed
planes
F. Movement:    Observed                   Score:          [DATE]
F. Breathing:   Observed
Biometry

BPD:      77.1  mm     G. Age:  31w 0d          1  %    CI:         68.1   %    70 - 86
FL/HC:      22.0   %    19.4 -
HC:      298.8  mm     G. Age:  33w 1d          8  %    HC/AC:      0.91        0.96 -
AC:      327.3  mm     G. Age:  36w 4d       > 97  %    FL/BPD:     85.2   %    71 - 87
FL:       65.7  mm     G. Age:  33w 6d         43  %    FL/AC:      20.1   %    20 - 24

Est. FW:    5787  gm    5 lb 10 oz      79  %
Gestational Age

LMP:           38w 1d        Date:  08/28/16                 EDD:   06/04/17
U/S Today:     33w 5d                                        EDD:   07/05/17
Best:          33w 5d     Det. By:  U/S (05/22/17)           EDD:   07/05/17
Anatomy

Cranium:               Appears normal         Aortic Arch:            Not well visualized
Cavum:                 Not well visualized    Ductal Arch:            Not well visualized
Ventricles:            Not well visualized    Diaphragm:              Not well visualized
Choroid Plexus:        Appears normal         Stomach:                Appears normal, left
sided
Cerebellum:            Not well visualized    Abdomen:                Appears normal
Posterior Fossa:       Not well visualized    Abdominal Wall:         Not well visualized
Nuchal Fold:           Not applicable (>20    Cord Vessels:           Appears normal (3
wks GA)                                        vessel cord)
Face:                  Orbits appear          Kidneys:                Appear normal
normal
Lips:                  Appears normal         Bladder:                Appears normal
Thoracic:              Appears normal         Spine:                  Ltd views no
intracranial signs of
NT
Heart:                 Not well visualized    Upper Extremities:      Not well visualized
RVOT:                  Not well visualized    Lower Extremities:      Not well visualized
LVOT:                  Not well visualized

Other:  Fetus appears to be a female. Technically difficult due to advanced
GA and fetal position.
Doppler - Fetal Vessels

Umbilical Artery
S/D     %tile
2.8       62
Cervix Uterus Adnexa

Cervix
Not visualized (advanced GA >09wks)
Impression

Singleton intrauterine pregnancy at 38+1 weeks by  uncertain
menstrual dates with minimal PNC and substance abuse
Review of the anatomy shows no sonographic markers for
aneuploidy or structural anomalies
However, the anatomic evaluations should be considered
suboptimal secondary to late EGA and fetal position
Amniotic fluid volume is normal
Biometry suggests an EGA of 33+5 weeks
Estimated fetal weight is 2545g which is growth in the 79th
percentile
BPP [DATE]
UA dopplers are [place];
Recommendations

Recommend using EDC of 07/05/2017
given the uncertainty due to the late US, I recommend BPP
and UA doppler next week and in 2 weeks, followed by a
check of growth at the third week

## 2019-06-24 ENCOUNTER — Other Ambulatory Visit: Payer: Self-pay | Admitting: *Deleted

## 2019-06-24 DIAGNOSIS — Z20822 Contact with and (suspected) exposure to covid-19: Secondary | ICD-10-CM

## 2019-06-25 LAB — NOVEL CORONAVIRUS, NAA: SARS-CoV-2, NAA: NOT DETECTED

## 2019-07-09 LAB — PREGNANCY, URINE: Preg Test, Ur: POSITIVE

## 2019-07-23 ENCOUNTER — Telehealth: Payer: Self-pay | Admitting: Obstetrics and Gynecology

## 2019-07-23 NOTE — Telephone Encounter (Signed)
Called the patient to verify the lmp or EDD as she requested confirmation of pregnancy records be sent to our office. However the lmp is not listed.

## 2019-07-31 ENCOUNTER — Encounter: Payer: Self-pay | Admitting: *Deleted

## 2019-08-07 ENCOUNTER — Other Ambulatory Visit: Payer: Self-pay

## 2019-08-07 ENCOUNTER — Ambulatory Visit (INDEPENDENT_AMBULATORY_CARE_PROVIDER_SITE_OTHER): Payer: Self-pay | Admitting: *Deleted

## 2019-08-07 DIAGNOSIS — Z8759 Personal history of other complications of pregnancy, childbirth and the puerperium: Secondary | ICD-10-CM

## 2019-08-07 DIAGNOSIS — O99320 Drug use complicating pregnancy, unspecified trimester: Secondary | ICD-10-CM

## 2019-08-07 DIAGNOSIS — F1120 Opioid dependence, uncomplicated: Secondary | ICD-10-CM | POA: Insufficient documentation

## 2019-08-07 DIAGNOSIS — F419 Anxiety disorder, unspecified: Secondary | ICD-10-CM

## 2019-08-07 DIAGNOSIS — O0990 Supervision of high risk pregnancy, unspecified, unspecified trimester: Secondary | ICD-10-CM

## 2019-08-07 DIAGNOSIS — F1911 Other psychoactive substance abuse, in remission: Secondary | ICD-10-CM

## 2019-08-07 NOTE — Patient Instructions (Signed)

## 2019-08-07 NOTE — Progress Notes (Signed)
I connected with  Gina Foley on 08/07/19 at  1:30 PM EST by telephone and verified that I am speaking with the correct person using two identifiers.   I discussed the limitations, risks, security and privacy concerns of performing an evaluation and management service by telephone and the availability of in person appointments. I also discussed with the patient that there may be a patient responsible charge related to this service. The patient expressed understanding and agreed to proceed. Explained I am completing her New OB Intake today. We discussed Her EDD and that it is based on  [place] . She had been seen in [facility]. She reports she had positive pregnancy test in  Early May and then had a lot of bleeding in middle of May around 19th for a week and a half. Then did home pregnancy test beginning of June which was negative. Then went to [facility] and they did [place] and told her she conceived in early June.  I reviewed her allergies, meds, OB History, Medical /Surgical history, and appropriate screenings. Patient and/or legal guardian verbally consented to [facility] services about presenting concerns and psychiatric consultation as appropriate. Due to her hx depression, anxiety, drug use I offered her appt with [facility] which she accepted.   I explained I will send her the [employer] app- app sent to her while on phone.  I explained we will send a blood pressure cuff to [facility] once she has medicaid approved.  Explained  then we will have her take her blood pressure weekly and enter into the app. She states she may buy a blood pressure cuff before then. I explained she will have some visits in office and some virtually. She already has MyChart but forgot her password. I assisted her with changing password and downloading the MyChart app. I  Reviewed her  appointment date/ time with her , our location and to wear mask, no visitors. Explained she will have exam, ob bloodwork,  hemoglobin a1C, cbg , genetic testing if desired, pap if needed. I scheduled an [place] at first available appointment and gave her the appointment. She voices understanding.  Vaughan Basta, RN 08/07/2019  1:28 PM

## 2019-08-10 NOTE — Progress Notes (Signed)
Patient seen and assessed by nursing staff during this encounter. I have reviewed the chart and agree with the documentation and plan.  Tiffiny Worthy, MD 08/10/2019 10:24 AM    

## 2019-08-12 ENCOUNTER — Encounter: Payer: Self-pay | Admitting: *Deleted

## 2019-08-20 ENCOUNTER — Encounter: Payer: Self-pay | Admitting: Obstetrics & Gynecology

## 2019-08-20 NOTE — BH Specialist Note (Signed)
Integrated Behavioral Health Initial Visit  MRN: [id number] Name: Gina Foley  Number of [facility] Clinician visits:: 1/6 Session Start time: 2:52  Session End time: 3:25 Total time: 23 (10 min w clinical staff getting flu shot and labs)  Type of Service: [place] Interpretor:Yes.   Interpretor Name and Language: n/a   Warm Hand Off Completed.       SUBJECTIVE: Gina Foley is a 33 y.o. female accompanied by n/a Patient was referred by Verita Schneiders, MD for [facility] hx (ADHD, PTSD, anxiety, depression, substance history). Patient reports the following symptoms/concerns: Pt states she is seeing a psychiatrist, is taking medication as prescribed, and primary concern today is being uninsured, along with needing to apply for EBT and finding out information about the women's hospital.  Duration of problem: Current pregnancy; Severity of problem: moderate  OBJECTIVE: Mood: Normal and Affect: Appropriate Risk of harm to self or others: No plan to harm self or others  LIFE CONTEXT: Family and Social: Pt lives with FOB in [place]; children temporarily with family School/Work: - Self-Care: - Life Changes: Current pregnancy; recent move  GOALS ADDRESSED: Patient will: 1. Reduce symptoms of: anxiety, depression and stress 2. Increase knowledge and/or ability of: healthy habits and stress reduction  3. Demonstrate ability to: Increase healthy adjustment to current life circumstances and Increase adequate support systems for patient/family  INTERVENTIONS: Interventions utilized: Psychoeducation and/or Health Education and Link to [employer]  Standardized Assessments completed: GAD-7 and PHQ 9  ASSESSMENT: Patient currently experiencing Depressive disorder, PTSD, and ADHD, all recently diagnosed via Psychiatry   Patient may benefit from psychoeducation and brief therapeutic interventions regarding coping  with symptoms of anxiety, depression, and life stress .  PLAN: 1. Follow up with behavioral health clinician on : As needed 2. Behavioral recommendations:  -Continue seeing psychiatrist; taking [place] medication as prescribed -Continue taking prenatal vitamins daily (through pregnancy to postpartum visit) -[facility] with FOB -Continue to prioritize healthy sleep; consider sleep sound app, as needed -Sign [id number] Consent sent to phone today (for EBT referral) -[place] online or call for [employer] application 3. Referral(s): [facility] (In Clinic) 4. "From scale of 1-10, how likely are you to follow plan?": -  Garlan Fair, LCSW  Depression screen [facility] 2/9 08/07/2019 05/18/2017 04/14/2017  Decreased Interest 1 3 2   Down, Depressed, Hopeless 1 3 1   PHQ - 2 Score 2 6 3   Altered sleeping 0 3 -  Tired, decreased energy 1 1 3   Change in appetite 0 1 2  Feeling bad or failure about yourself  0 1 0  Trouble concentrating 0 1 1  Moving slowly or fidgety/restless 0 0 0  Suicidal thoughts 0 0 0  PHQ-9 Score 3 13 -   GAD 7 : Generalized Anxiety Score 08/07/2019 05/18/2017 04/14/2017  Nervous, Anxious, on Edge 1 3 1   Control/stop worrying 0 3 2  Worry too much - different things 1 3 2   Trouble relaxing 0 3 2  Restless 0 3 1  Easily annoyed or irritable 1 3 3   Afraid - awful might happen 0 3 0  Total GAD 7 Score 3 21 11

## 2019-08-21 ENCOUNTER — Other Ambulatory Visit: Payer: Self-pay

## 2019-08-21 ENCOUNTER — Ambulatory Visit (INDEPENDENT_AMBULATORY_CARE_PROVIDER_SITE_OTHER): Payer: Self-pay | Admitting: Obstetrics & Gynecology

## 2019-08-21 ENCOUNTER — Ambulatory Visit (INDEPENDENT_AMBULATORY_CARE_PROVIDER_SITE_OTHER): Payer: Self-pay | Admitting: Clinical

## 2019-08-21 VITALS — BP 110/77 | HR 105 | Wt 174.1 lb

## 2019-08-21 DIAGNOSIS — O219 Vomiting of pregnancy, unspecified: Secondary | ICD-10-CM

## 2019-08-21 DIAGNOSIS — N898 Other specified noninflammatory disorders of vagina: Secondary | ICD-10-CM

## 2019-08-21 DIAGNOSIS — Z79899 Other long term (current) drug therapy: Secondary | ICD-10-CM

## 2019-08-21 DIAGNOSIS — O0932 Supervision of pregnancy with insufficient antenatal care, second trimester: Secondary | ICD-10-CM

## 2019-08-21 DIAGNOSIS — O0992 Supervision of high risk pregnancy, unspecified, second trimester: Secondary | ICD-10-CM

## 2019-08-21 DIAGNOSIS — Z113 Encounter for screening for infections with a predominantly sexual mode of transmission: Secondary | ICD-10-CM

## 2019-08-21 DIAGNOSIS — O0990 Supervision of high risk pregnancy, unspecified, unspecified trimester: Secondary | ICD-10-CM

## 2019-08-21 DIAGNOSIS — O09892 Supervision of other high risk pregnancies, second trimester: Secondary | ICD-10-CM

## 2019-08-21 DIAGNOSIS — R87611 Atypical squamous cells cannot exclude high grade squamous intraepithelial lesion on cytologic smear of cervix (ASC-H): Secondary | ICD-10-CM

## 2019-08-21 DIAGNOSIS — Z1151 Encounter for screening for human papillomavirus (HPV): Secondary | ICD-10-CM

## 2019-08-21 DIAGNOSIS — F1120 Opioid dependence, uncomplicated: Secondary | ICD-10-CM

## 2019-08-21 DIAGNOSIS — F902 Attention-deficit hyperactivity disorder, combined type: Secondary | ICD-10-CM

## 2019-08-21 DIAGNOSIS — F329 Major depressive disorder, single episode, unspecified: Secondary | ICD-10-CM

## 2019-08-21 DIAGNOSIS — F1911 Other psychoactive substance abuse, in remission: Secondary | ICD-10-CM

## 2019-08-21 DIAGNOSIS — O99322 Drug use complicating pregnancy, second trimester: Secondary | ICD-10-CM

## 2019-08-21 DIAGNOSIS — K219 Gastro-esophageal reflux disease without esophagitis: Secondary | ICD-10-CM

## 2019-08-21 DIAGNOSIS — F419 Anxiety disorder, unspecified: Secondary | ICD-10-CM

## 2019-08-21 DIAGNOSIS — O0930 Supervision of pregnancy with insufficient antenatal care, unspecified trimester: Secondary | ICD-10-CM

## 2019-08-21 DIAGNOSIS — F32A Depression, unspecified: Secondary | ICD-10-CM

## 2019-08-21 DIAGNOSIS — Z23 Encounter for immunization: Secondary | ICD-10-CM

## 2019-08-21 DIAGNOSIS — O99619 Diseases of the digestive system complicating pregnancy, unspecified trimester: Secondary | ICD-10-CM

## 2019-08-21 DIAGNOSIS — B379 Candidiasis, unspecified: Secondary | ICD-10-CM

## 2019-08-21 DIAGNOSIS — Z8759 Personal history of other complications of pregnancy, childbirth and the puerperium: Secondary | ICD-10-CM

## 2019-08-21 DIAGNOSIS — O99612 Diseases of the digestive system complicating pregnancy, second trimester: Secondary | ICD-10-CM

## 2019-08-21 DIAGNOSIS — Z3A25 25 weeks gestation of pregnancy: Secondary | ICD-10-CM

## 2019-08-21 DIAGNOSIS — Z124 Encounter for screening for malignant neoplasm of cervix: Secondary | ICD-10-CM

## 2019-08-21 DIAGNOSIS — F4310 Post-traumatic stress disorder, unspecified: Secondary | ICD-10-CM

## 2019-08-21 DIAGNOSIS — B373 Candidiasis of vulva and vagina: Secondary | ICD-10-CM

## 2019-08-21 MED ORDER — ASPIRIN EC 81 MG PO TBEC: 81.0000 mg | DELAYED_RELEASE_TABLET | Freq: Every day | ORAL | 2 refills | Status: DC

## 2019-08-21 MED ORDER — ONDANSETRON 4 MG PO TBDP: 4.0000 mg | ORAL_TABLET | Freq: Four times a day (QID) | ORAL | 0 refills | Status: DC | PRN

## 2019-08-21 MED ORDER — PROMETHAZINE HCL 25 MG PO TABS: 25.0000 mg | ORAL_TABLET | Freq: Four times a day (QID) | ORAL | 2 refills | Status: DC | PRN

## 2019-08-21 MED ORDER — FAMOTIDINE 20 MG PO TABS: 20.0000 mg | ORAL_TABLET | Freq: Two times a day (BID) | ORAL | 3 refills | Status: DC

## 2019-08-21 NOTE — Patient Instructions (Signed)

## 2019-08-21 NOTE — Patient Instructions (Signed)

## 2019-08-21 NOTE — Progress Notes (Signed)
History:   Gina Foley is a 33 y.o. [id number] at 4952w6d by midtrimester ultrasound being seen today for her first obstetrical visit.  Her obstetrical history is significant for late prenatal care and other problems (see below). Patient does intend to breast feed. Pregnancy history fully reviewed.  Patient reports heartburn, nausea and vomiting, desires medication to help.      Patient Active Problem List   Diagnosis Date Noted  . History of preterm delivery, currently pregnant in second trimester 08/21/2019  . Supervision of high risk pregnancy, antepartum 08/07/2019  . Methadone maintenance treatment affecting pregnancy (HCC) 08/07/2019  . History of gestational hypertension 08/07/2019  . H/O: substance abuse (HCC)   . Rubella non-immune status, antepartum 05/18/2017  . Atypical squamous cells cannot exclude high grade squamous intraepithelial lesion on cytologic smear of cervix (ASC-H) 04/26/2017  . Late prenatal care 04/14/2017  . Chronic prescription benzodiazepine use 04/14/2017  . Drug abuse (HCC) 04/14/2017  . Anxiety 04/14/2017  . ADHD (attention deficit hyperactivity disorder), combined type 08/25/2015  . Posttraumatic stress disorder 07/22/2013  . Gastroesophageal reflux during pregnancy, antepartum 12/20/2002    HISTORY: OB History  Gravida Para Term Preterm AB Living  7 4 3 1 2 4   SAB TAB Ectopic Multiple Live Births  0 2 0 0 4    # Outcome Date GA Lbr Len/2nd Weight Sex Delivery Anes PTL Lv  7 Current           6 Preterm 05/23/17 5429w6d 07:13 / 00:02 5 lb 13.1 oz (2.64 kg) F Vag-Spont EPI  LIV     Birth Comments: on methadone     Name: Gina Foley,Gina Foley     Apgar1: 6  Apgar5: 8  5 Term 12/03/15 428w0d  5 lb 11 oz (2.58 kg) F Vag-Spont   LIV     Birth Comments: on  methadone  4 TAB 2014          3 Term 07/03/12   5 lb 14 oz (2.665 kg) M Vag-Spont   LIV     Birth Comments: on methadone  2 TAB 09/12/09          1 Term 04/17/08 1675w0d  6 lb 12 oz (3.062 kg) M  Vag-Spont   LIV     Birth Comments: on methadone    Last pap smear was done 2018 and was abnormal - ASC-H, benign colposcopy  Past Medical History:  Diagnosis Date  . Anxiety   . Depression   . Gallstones   . Gonorrhea   . H/O: substance abuse (HCC)   . Infection    UTI  . Preterm labor    Past Surgical History:  Procedure Laterality Date  . NO PAST SURGERIES    . THERAPEUTIC ABORTION    . WISDOM TOOTH EXTRACTION     Family History  Problem Relation Age of Onset  . Heart disease Maternal Grandmother   . Stroke Maternal Grandmother    Social History   Tobacco Use  . Smoking status: Former Smoker    Packs/day: 0.25    Types: Cigarettes    Quit date: 06/28/2019    Years since quitting: 0.1  . Smokeless tobacco: Never Used  . Tobacco comment: had cut back, stopped a few wks ago  Substance Use Topics  . Alcohol use: No  . Drug use: Yes    Types: Marijuana, Heroin, Oxycodone    Comment: Methadone- dly; marijuana 3wks ago (oil)   No Known Allergies Current Outpatient Medications  on File Prior to Visit  Medication Sig Dispense Refill  . clonazePAM (KLONOPIN) 1 MG tablet Take 1 tablet (1 mg total) by mouth 2 (two) times daily as needed for anxiety. 5 tablet 0  . methadone (DOLOPHINE) 10 MG/ML solution Take 100 mg by mouth daily.     . Prenatal Vit-Fe Fumarate-FA (MULTIVITAMIN-PRENATAL) 27-0.8 MG TABS tablet Take 1 tablet by mouth daily at 12 noon.     No current facility-administered medications on file prior to visit.     Review of Systems Pertinent items noted in HPI and remainder of comprehensive ROS otherwise negative. Physical Exam:   Vitals:   08/21/19 1400  BP: 110/77  Pulse: (!) 105  Weight: 174 lb 1.6 oz (79 kg)   Fetal Heart Rate (bpm): 140 Uterus:  Fundal Height: 26 cm  Pelvic Exam: Perineum: no hemorrhoids, normal perineum   Vulva: normal external genitalia, no lesions   Vagina:  normal mucosa, normal discharge   Cervix: no lesions and normal,  pap smear done. There was some bleeding after pap.    Adnexa: normal adnexa and no mass, fullness, tenderness   Bony Pelvis: average  System: General: well-developed, well-nourished female in no acute distress   Breasts:  normal appearance, no masses or tenderness bilaterally   Skin: normal coloration and turgor, no rashes   Neurologic: oriented, normal, negative, normal mood   Extremities: normal strength, tone, and muscle mass, ROM of all joints is normal   HEENT PERRLA, extraocular movement intact and sclera clear, anicteric   Mouth/Teeth mucous membranes moist, pharynx normal without lesions and dental hygiene good   Neck supple and no masses   Cardiovascular: regular rate and rhythm   Respiratory:  no respiratory distress, normal breath sounds   Abdomen: soft, non-tender; bowel sounds normal; no masses,  no organomegaly     Assessment:    Pregnancy: [id number] Patient Active Problem List   Diagnosis Date Noted  . History of preterm delivery, currently pregnant in second trimester 08/21/2019  . Supervision of high risk pregnancy, antepartum 08/07/2019  . Methadone maintenance treatment affecting pregnancy (HCC) 08/07/2019  . History of gestational hypertension 08/07/2019  . H/O: substance abuse (HCC)   . Rubella non-immune status, antepartum 05/18/2017  . Atypical squamous cells cannot exclude high grade squamous intraepithelial lesion on cytologic smear of cervix (ASC-H) 04/26/2017  . Late prenatal care 04/14/2017  . Chronic prescription benzodiazepine use 04/14/2017  . Drug abuse (HCC) 04/14/2017  . Anxiety 04/14/2017  . ADHD (attention deficit hyperactivity disorder), combined type 08/25/2015  . Posttraumatic stress disorder 07/22/2013  . Gastroesophageal reflux during pregnancy, antepartum 12/20/2002     Plan:    1. Methadone maintenance treatment affecting pregnancy in second trimester (HCC) 2. Chronic prescription benzodiazepine use 3. H/O: substance abuse (HCC) -  Will continue to get methadone and medications from her providers - Drug screen in urine - Will need NICU RN/NAS referral  4. History of gestational hypertension - CMP - Protein / creatinine ratio, urine - aspirin EC 81 MG tablet; Take 1 tablet (81 mg total) by mouth daily. Take for prevention of preeclampsia later in pregnancy  Dispense: 300 tablet; Refill: 2  5. History of preterm delivery, currently pregnant in second trimester Too late for [place]  6. Anxiety Continue Klonopin  7. Pregnancy related nausea and vomiting, antepartum Medications prescribed - ondansetron (ZOFRAN ODT) 4 MG disintegrating tablet; Take 1 tablet (4 mg total) by mouth every 6 (six) hours as needed for nausea.  Dispense: 20 tablet;  Refill: 0 - promethazine (PHENERGAN) 25 MG tablet; Take 1 tablet (25 mg total) by mouth every 6 (six) hours as needed for nausea or vomiting.  Dispense: 30 tablet; Refill: 2 - famotidine (PEPCID) 20 MG tablet; Take 1 tablet (20 mg total) by mouth 2 (two) times daily.  Dispense: 60 tablet; Refill: 3  8. Gastroesophageal reflux during pregnancy, antepartum - famotidine (PEPCID) 20 MG tablet; Take 1 tablet (20 mg total) by mouth 2 (two) times daily.  Dispense: 60 tablet; Refill: 3  9. Atypical squamous cells cannot exclude high grade squamous intraepithelial lesion on cytologic smear of cervix (ASC-H) Pap done today.  10. Late prenatal care 11. Supervision of high risk pregnancy, antepartum - Hemoglobin A1c - Culture, OB Urine - Cytology - PAP( [place]) - Cervicovaginal ancillary only( [place]) - TSH - Flu Vaccine QUAD 36+ mos IM (Fluarix, Quad PF) - Obstetric Panel, Including HIV Initial labs drawn. Continue prenatal vitamins. Ultrasound discussed; fetal anatomic survey: scheduled. Problem list reviewed and updated. The nature of [place] with multiple MDs and other Advanced Practice Providers was explained to patient; also  emphasized that residents, students are part of our team. Routine obstetric precautions reviewed. Return in about 3 weeks (around 09/11/2019) for 2 hr GTT, 3rd trimester labs, TDap, OFFICE OB Visit.     Verita Schneiders, MD, [place] for [employer], [place]

## 2019-08-22 LAB — TSH: TSH: 1.8 u[IU]/mL (ref 0.450–4.500)

## 2019-08-22 LAB — PROTEIN / CREATININE RATIO, URINE
Creatinine, Urine: 125.8 mg/dL
Protein, Ur: 5.6 mg/dL
Protein/Creat Ratio: 45 mg/g creat (ref 0–200)

## 2019-08-22 LAB — COMPREHENSIVE METABOLIC PANEL
ALT: 6 IU/L (ref 0–32)
AST: 14 IU/L (ref 0–40)
Albumin/Globulin Ratio: 1.3 (ref 1.2–2.2)
Albumin: 3.7 g/dL — ABNORMAL LOW (ref 3.8–4.8)
Alkaline Phosphatase: 62 IU/L (ref 39–117)
BUN/Creatinine Ratio: 10 (ref 9–23)
BUN: 7 mg/dL (ref 6–20)
Bilirubin Total: 0.2 mg/dL (ref 0.0–1.2)
CO2: 20 mmol/L (ref 20–29)
Calcium: 9.1 mg/dL (ref 8.7–10.2)
Chloride: 103 mmol/L (ref 96–106)
Creatinine, Ser: 0.68 mg/dL (ref 0.57–1.00)
GFR calc Af Amer: 135 mL/min/{1.73_m2} (ref >59)
GFR calc non Af Amer: 117 mL/min/{1.73_m2} (ref >59)
Globulin, Total: 2.8 g/dL (ref 1.5–4.5)
Glucose: 62 mg/dL — ABNORMAL LOW (ref 65–99)
Potassium: 4.3 mmol/L (ref 3.5–5.2)
Sodium: 137 mmol/L (ref 134–144)
Total Protein: 6.5 g/dL (ref 6.0–8.5)

## 2019-08-22 LAB — OBSTETRIC PANEL, INCLUDING HIV
Antibody Screen: NEGATIVE
Basophils Absolute: 0 10*3/uL (ref 0.0–0.2)
Basos: 0 %
EOS (ABSOLUTE): 0.1 10*3/uL (ref 0.0–0.4)
Eos: 1 %
HIV Screen 4th Generation wRfx: NONREACTIVE
Hematocrit: 34.1 % (ref 34.0–46.6)
Hemoglobin: 11.6 g/dL (ref 11.1–15.9)
Hepatitis B Surface Ag: NEGATIVE
Immature Grans (Abs): 0 10*3/uL (ref 0.0–0.1)
Immature Granulocytes: 1 %
Lymphocytes Absolute: 1.8 10*3/uL (ref 0.7–3.1)
Lymphs: 30 %
MCH: 29.8 pg (ref 26.6–33.0)
MCHC: 34 g/dL (ref 31.5–35.7)
MCV: 88 fL (ref 79–97)
Monocytes Absolute: 0.4 10*3/uL (ref 0.1–0.9)
Monocytes: 7 %
Neutrophils Absolute: 3.7 10*3/uL (ref 1.4–7.0)
Neutrophils: 61 %
Platelets: 194 10*3/uL (ref 150–450)
RBC: 3.89 x10E6/uL (ref 3.77–5.28)
RDW: 12.5 % (ref 11.7–15.4)
RPR Ser Ql: NONREACTIVE
Rh Factor: POSITIVE
Rubella Antibodies, IGG: 3.07 index (ref >0.99)
WBC: 6 10*3/uL (ref 3.4–10.8)

## 2019-08-22 LAB — HEMOGLOBIN A1C
Est. average glucose Bld gHb Est-mCnc: 91 mg/dL
Hgb A1c MFr Bld: 4.8 % (ref 4.8–5.6)

## 2019-08-23 ENCOUNTER — Encounter (HOSPITAL_COMMUNITY): Payer: Self-pay

## 2019-08-23 ENCOUNTER — Ambulatory Visit (HOSPITAL_COMMUNITY)
Admission: RE | Admit: 2019-08-23 | Discharge: 2019-08-23 | Disposition: A | Discharge status: Home / Self Care | Payer: Self-pay | Source: Ambulatory Visit | Attending: Obstetrics and Gynecology | Admitting: Obstetrics and Gynecology

## 2019-08-23 ENCOUNTER — Other Ambulatory Visit: Payer: Self-pay

## 2019-08-23 ENCOUNTER — Other Ambulatory Visit (HOSPITAL_COMMUNITY): Payer: Self-pay | Admitting: *Deleted

## 2019-08-23 ENCOUNTER — Ambulatory Visit (HOSPITAL_COMMUNITY): Payer: Self-pay | Admitting: *Deleted

## 2019-08-23 DIAGNOSIS — F1120 Opioid dependence, uncomplicated: Secondary | ICD-10-CM

## 2019-08-23 DIAGNOSIS — F1911 Other psychoactive substance abuse, in remission: Secondary | ICD-10-CM | POA: Insufficient documentation

## 2019-08-23 DIAGNOSIS — O99322 Drug use complicating pregnancy, second trimester: Secondary | ICD-10-CM | POA: Insufficient documentation

## 2019-08-23 DIAGNOSIS — Z8759 Personal history of other complications of pregnancy, childbirth and the puerperium: Secondary | ICD-10-CM

## 2019-08-23 DIAGNOSIS — O0990 Supervision of high risk pregnancy, unspecified, unspecified trimester: Secondary | ICD-10-CM | POA: Insufficient documentation

## 2019-08-23 DIAGNOSIS — O09212 Supervision of pregnancy with history of pre-term labor, second trimester: Secondary | ICD-10-CM

## 2019-08-23 DIAGNOSIS — F1920 Other psychoactive substance dependence, uncomplicated: Secondary | ICD-10-CM

## 2019-08-23 DIAGNOSIS — O99320 Drug use complicating pregnancy, unspecified trimester: Secondary | ICD-10-CM | POA: Insufficient documentation

## 2019-08-23 DIAGNOSIS — Z3A26 26 weeks gestation of pregnancy: Secondary | ICD-10-CM

## 2019-08-23 LAB — CERVICOVAGINAL ANCILLARY ONLY
Bacterial Vaginitis (gardnerella): NEGATIVE
Candida Glabrata: POSITIVE — AB
Candida Vaginitis: NEGATIVE
Chlamydia: NEGATIVE
Comment: NEGATIVE
Comment: NEGATIVE
Comment: NEGATIVE
Comment: NEGATIVE
Comment: NEGATIVE
Comment: NORMAL
Neisseria Gonorrhea: NEGATIVE
Trichomonas: NEGATIVE

## 2019-08-23 LAB — URINE CULTURE, OB REFLEX

## 2019-08-23 LAB — CULTURE, OB URINE

## 2019-08-26 ENCOUNTER — Other Ambulatory Visit: Payer: Self-pay

## 2019-08-26 ENCOUNTER — Telehealth: Payer: Self-pay | Admitting: Pediatrics

## 2019-08-26 DIAGNOSIS — B379 Candidiasis, unspecified: Secondary | ICD-10-CM

## 2019-08-26 MED ORDER — NYSTATIN 100000 UNIT/GM EX CREA: TOPICAL_CREAM | CUTANEOUS | 2 refills | Status: DC

## 2019-08-26 NOTE — Telephone Encounter (Signed)
Received call from [place], [place], in regards to clarifying if the Nystatin cream is topical or is it to be inserted vaginally.

## 2019-08-26 NOTE — Addendum Note (Signed)
Addended by: Verita Schneiders A on: 08/26/2019 09:23 AM   Modules accepted: Orders

## 2019-08-27 ENCOUNTER — Telehealth: Payer: Self-pay | Admitting: General Practice

## 2019-08-27 ENCOUNTER — Encounter: Payer: Self-pay | Admitting: Obstetrics & Gynecology

## 2019-08-27 ENCOUNTER — Other Ambulatory Visit: Payer: Self-pay | Admitting: General Practice

## 2019-08-27 LAB — CYTOLOGY - PAP
Comment: NEGATIVE
Comment: NEGATIVE
Comment: NEGATIVE
Diagnosis: UNDETERMINED — AB
HPV 16: NEGATIVE
HPV 18 / 45: NEGATIVE
High risk HPV: POSITIVE — AB

## 2019-08-27 LAB — TOXASSURE SELECT 13 (MW), URINE

## 2019-08-27 NOTE — Telephone Encounter (Signed)
Nystatin suppositories Rx called in to [facility]. Called to inform patient, no answer- left message stating we are calling concerning results, please check your mychart account. A prescription has been called in to [facility] for you- you may call us back if you have questions.

## 2019-08-28 ENCOUNTER — Encounter: Payer: Self-pay | Admitting: General Practice

## 2019-08-28 ENCOUNTER — Telehealth: Payer: Self-pay | Admitting: General Practice

## 2019-08-28 NOTE — Telephone Encounter (Signed)
Called patient regarding pap smear result and recommendation for colposcopy to be done postpartum- no answer. Left message asking her to check her mychart account for more information. Will send message.

## 2019-09-09 ENCOUNTER — Other Ambulatory Visit: Payer: Self-pay | Admitting: *Deleted

## 2019-09-09 DIAGNOSIS — O0990 Supervision of high risk pregnancy, unspecified, unspecified trimester: Secondary | ICD-10-CM

## 2019-09-11 ENCOUNTER — Other Ambulatory Visit: Payer: Self-pay

## 2019-09-11 ENCOUNTER — Telehealth: Payer: Self-pay | Admitting: Family Medicine

## 2019-09-11 ENCOUNTER — Encounter: Payer: Self-pay | Admitting: Family Medicine

## 2019-09-11 NOTE — Telephone Encounter (Signed)
Attempted to contact patient to get her rescheduled for her ob and 2 hr gtt appointments. No answer, left voicemail for patient to give the office a call back to be rescheduled. No show letter mailed.

## 2019-09-13 NOTE — L&D Delivery Note (Signed)
Delivery Note Pt was found to be complete upon arrival to MAU and feeling urge to push. At 1258 a viable female infant was delivered via SVD, presentation: OA. APGAR: 8, 9; weight 7'1.   Placenta status: spontaneously delivered intact with gentle cord traction. Fundus firm with massage and Pitocin.   Anesthesia: none Lacerations: right labial- hemostatic Est. Blood Loss (mL): 100 Placenta to LD Complications: while on mothers chest infant became apneic and blue at 12 minutes of life, NRP was started by RN and Code Apgar called. See NICU team notes for further details.  Cord ph n/a   Mom to postpartum. Baby to Couplet care / Skin to Skin.    Donette Larry, CNM 11/23/2019 1:23 PM

## 2019-09-20 ENCOUNTER — Ambulatory Visit (HOSPITAL_COMMUNITY): Payer: Self-pay

## 2019-09-23 ENCOUNTER — Ambulatory Visit (HOSPITAL_COMMUNITY): Payer: Self-pay

## 2019-09-25 ENCOUNTER — Other Ambulatory Visit (HOSPITAL_COMMUNITY): Payer: Self-pay | Admitting: *Deleted

## 2019-09-25 ENCOUNTER — Ambulatory Visit (HOSPITAL_COMMUNITY)
Admission: RE | Admit: 2019-09-25 | Discharge: 2019-09-25 | Disposition: A | Discharge status: Home / Self Care | Payer: Self-pay | Source: Ambulatory Visit | Attending: Obstetrics and Gynecology | Admitting: Obstetrics and Gynecology

## 2019-09-25 ENCOUNTER — Encounter (HOSPITAL_COMMUNITY): Payer: Self-pay | Admitting: *Deleted

## 2019-09-25 ENCOUNTER — Ambulatory Visit (HOSPITAL_COMMUNITY): Payer: Self-pay | Admitting: *Deleted

## 2019-09-25 ENCOUNTER — Other Ambulatory Visit: Payer: Self-pay

## 2019-09-25 DIAGNOSIS — Z3A30 30 weeks gestation of pregnancy: Secondary | ICD-10-CM

## 2019-09-25 DIAGNOSIS — Z8759 Personal history of other complications of pregnancy, childbirth and the puerperium: Secondary | ICD-10-CM

## 2019-09-25 DIAGNOSIS — O99320 Drug use complicating pregnancy, unspecified trimester: Secondary | ICD-10-CM | POA: Insufficient documentation

## 2019-09-25 DIAGNOSIS — F1920 Other psychoactive substance dependence, uncomplicated: Secondary | ICD-10-CM

## 2019-09-25 DIAGNOSIS — O0990 Supervision of high risk pregnancy, unspecified, unspecified trimester: Secondary | ICD-10-CM

## 2019-09-25 DIAGNOSIS — F1120 Opioid dependence, uncomplicated: Secondary | ICD-10-CM | POA: Insufficient documentation

## 2019-09-25 DIAGNOSIS — O99323 Drug use complicating pregnancy, third trimester: Secondary | ICD-10-CM

## 2019-09-25 DIAGNOSIS — O0933 Supervision of pregnancy with insufficient antenatal care, third trimester: Secondary | ICD-10-CM

## 2019-09-25 DIAGNOSIS — O09213 Supervision of pregnancy with history of pre-term labor, third trimester: Secondary | ICD-10-CM

## 2019-09-25 DIAGNOSIS — F1910 Other psychoactive substance abuse, uncomplicated: Secondary | ICD-10-CM

## 2019-09-27 ENCOUNTER — Encounter: Payer: Self-pay | Admitting: Family Medicine

## 2019-10-03 ENCOUNTER — Encounter: Payer: Self-pay | Admitting: Obstetrics & Gynecology

## 2019-10-18 ENCOUNTER — Telehealth: Payer: Self-pay | Admitting: Obstetrics and Gynecology

## 2019-10-18 ENCOUNTER — Encounter: Payer: Self-pay | Admitting: Obstetrics and Gynecology

## 2019-10-18 NOTE — Telephone Encounter (Signed)
Received a call from this patient stating she was not feeling well today. She has been throwing up, and having some abdominal  pains. Dr. Alysia Penna stated she should go to the hospital for evaluation.

## 2019-10-23 ENCOUNTER — Ambulatory Visit (HOSPITAL_COMMUNITY): Admission: RE | Admit: 2019-10-23 | Payer: Self-pay | Source: Ambulatory Visit

## 2019-10-23 ENCOUNTER — Encounter: Payer: Self-pay | Admitting: Obstetrics and Gynecology

## 2019-10-23 ENCOUNTER — Ambulatory Visit (HOSPITAL_COMMUNITY): Payer: Self-pay | Attending: Obstetrics and Gynecology

## 2019-10-23 ENCOUNTER — Encounter (HOSPITAL_COMMUNITY): Payer: Self-pay

## 2019-10-24 NOTE — Progress Notes (Signed)
Patient did not keep her OB appointment for 10/23/2019.  Cornelia Copa MD Attending Center for [employer] [facility])

## 2019-10-31 ENCOUNTER — Encounter: Payer: Self-pay | Admitting: Family Medicine

## 2019-11-08 ENCOUNTER — Ambulatory Visit (INDEPENDENT_AMBULATORY_CARE_PROVIDER_SITE_OTHER): Payer: Self-pay | Admitting: Obstetrics and Gynecology

## 2019-11-08 ENCOUNTER — Other Ambulatory Visit: Payer: Self-pay

## 2019-11-08 VITALS — BP 115/74 | HR 100 | Wt 202.0 lb

## 2019-11-08 DIAGNOSIS — O0993 Supervision of high risk pregnancy, unspecified, third trimester: Secondary | ICD-10-CM

## 2019-11-08 DIAGNOSIS — Z113 Encounter for screening for infections with a predominantly sexual mode of transmission: Secondary | ICD-10-CM

## 2019-11-08 DIAGNOSIS — O99323 Drug use complicating pregnancy, third trimester: Secondary | ICD-10-CM

## 2019-11-08 DIAGNOSIS — F1120 Opioid dependence, uncomplicated: Secondary | ICD-10-CM

## 2019-11-08 DIAGNOSIS — O0990 Supervision of high risk pregnancy, unspecified, unspecified trimester: Secondary | ICD-10-CM

## 2019-11-08 DIAGNOSIS — R87610 Atypical squamous cells of undetermined significance on cytologic smear of cervix (ASC-US): Secondary | ICD-10-CM

## 2019-11-08 DIAGNOSIS — O219 Vomiting of pregnancy, unspecified: Secondary | ICD-10-CM

## 2019-11-08 DIAGNOSIS — R87810 Cervical high risk human papillomavirus (HPV) DNA test positive: Secondary | ICD-10-CM

## 2019-11-08 DIAGNOSIS — F419 Anxiety disorder, unspecified: Secondary | ICD-10-CM

## 2019-11-08 DIAGNOSIS — Z3A37 37 weeks gestation of pregnancy: Secondary | ICD-10-CM

## 2019-11-08 DIAGNOSIS — K5903 Drug induced constipation: Secondary | ICD-10-CM

## 2019-11-08 DIAGNOSIS — F1910 Other psychoactive substance abuse, uncomplicated: Secondary | ICD-10-CM

## 2019-11-08 MED ORDER — PROMETHAZINE HCL 25 MG PO TABS: 25.0000 mg | ORAL_TABLET | Freq: Four times a day (QID) | ORAL | 0 refills | Status: DC | PRN

## 2019-11-08 MED ORDER — POLYETHYLENE GLYCOL 3350 17 G PO PACK: 17.0000 g | PACK | Freq: Two times a day (BID) | ORAL | 0 refills | Status: DC

## 2019-11-08 NOTE — Addendum Note (Signed)
Addended by: Glendora Bing on: 11/08/2019 10:35 AM   Modules accepted: Orders

## 2019-11-08 NOTE — Progress Notes (Signed)
Prenatal Visit Note Date: 11/08/2019 Clinic: [facility]  Subjective:  Gina Foley is a 34 y.o. [id number] at [redacted]w[redacted]d being seen today for ongoing prenatal care.  She is currently monitored for the following issues for this high-risk pregnancy and has Late prenatal care; Chronic prescription benzodiazepine use; Drug abuse (HCC); Anxiety; ASCUS with positive high risk HPV cervical (negative 16, 18/45) on 08/21/2019; Rubella non-immune status, antepartum; H/O: substance abuse (HCC); ADHD (attention deficit hyperactivity disorder), combined type; Gastroesophageal reflux during pregnancy, antepartum; Posttraumatic stress disorder; Supervision of high risk pregnancy, antepartum; Methadone maintenance treatment affecting pregnancy (HCC); History of gestational hypertension; History of preterm delivery, currently pregnant in second trimester; Depressive disorder; and PTSD (post-traumatic stress disorder) on their problem list.  Patient reports no complaints.   Contractions: Not present. Vag. Bleeding: None.  Movement: Present. Denies leaking of fluid.   The following portions of the patient's history were reviewed and updated as appropriate: allergies, current medications, past family history, past medical history, past social history, past surgical history and problem list. Problem list updated.  Objective:   Vitals:   11/08/19 1001  BP: 115/74  Pulse: 100  Weight: 202 lb (91.6 kg)    Fetal Status: Fetal Heart Rate (bpm): 146 Fundal Height: 37 cm Movement: Present  Presentation: Vertex  General:  Alert, oriented and cooperative. Patient is in no acute distress.  Skin: Skin is warm and dry. No rash noted.   Cardiovascular: Normal heart rate noted  Respiratory: Normal respiratory effort, no problems with respiration noted  Abdomen: Soft, gravid, appropriate for gestational age. Pain/Pressure: Present     Pelvic:  Cervical exam performed Dilation: 1.5 Effacement (%): 20  Station: -3  Extremities: Normal range of motion.  Edema: Trace  Mental Status: Normal mood and affect. Normal behavior. Normal judgment and thought content.   Urinalysis:      Assessment and Plan:  Pregnancy: [id number] at [redacted]w[redacted]d  1. Supervision of high risk pregnancy, antepartum Routine care. D/w her that given her co-moribidities that she can have a 40wk IOL or can wait to 41wk if she desires. Pt leaning towards 40wk IOL; will let us know nv - Culture, beta strep (group b only) - GC/Chlamydia probe amp ([place])not at [facility]  2. ASCUS with positive high risk HPV cervical (negative 16, 18/45) on 08/21/2019 colpo pp  3. Drug abuse (HCC)  4. Anxiety See care everywhere. Has psych provider already  5. Methadone maintenance treatment affecting pregnancy in third trimester (HCC)  6. Pregnancy related nausea and vomiting, antepartum miralax also sent in for constipation - promethazine (PHENERGAN) 25 MG tablet; Take 1 tablet (25 mg total) by mouth every 6 (six) hours as needed for nausea or vomiting.  Dispense: 45 tablet; Refill: 0  Term labor symptoms and general obstetric precautions including but not limited to vaginal bleeding, contractions, leaking of fluid and fetal movement were reviewed in detail with the patient. Please refer to After Visit Summary for other counseling recommendations.  Return in about 1 week (around 11/15/2019) for high risk, in person.   [place] Bing, MD

## 2019-11-11 LAB — GC/CHLAMYDIA PROBE AMP (~~LOC~~) NOT AT ARMC
Chlamydia: NEGATIVE
Comment: NEGATIVE
Comment: NORMAL
Neisseria Gonorrhea: NEGATIVE

## 2019-11-12 LAB — CULTURE, BETA STREP (GROUP B ONLY): Strep Gp B Culture: NEGATIVE

## 2019-11-18 ENCOUNTER — Ambulatory Visit (HOSPITAL_COMMUNITY)
Admission: RE | Admit: 2019-11-18 | Discharge: 2019-11-18 | Disposition: A | Discharge status: Home / Self Care | Payer: Medicaid Other | Source: Ambulatory Visit | Attending: Obstetrics | Admitting: Obstetrics

## 2019-11-18 ENCOUNTER — Ambulatory Visit (INDEPENDENT_AMBULATORY_CARE_PROVIDER_SITE_OTHER): Payer: Self-pay | Admitting: Obstetrics and Gynecology

## 2019-11-18 ENCOUNTER — Encounter: Payer: Self-pay | Admitting: Obstetrics and Gynecology

## 2019-11-18 ENCOUNTER — Ambulatory Visit (HOSPITAL_COMMUNITY): Payer: Medicaid Other | Admitting: *Deleted

## 2019-11-18 ENCOUNTER — Telehealth: Payer: Self-pay

## 2019-11-18 ENCOUNTER — Other Ambulatory Visit: Payer: Self-pay

## 2019-11-18 ENCOUNTER — Encounter (HOSPITAL_COMMUNITY): Payer: Self-pay

## 2019-11-18 VITALS — BP 129/79 | HR 98

## 2019-11-18 DIAGNOSIS — F1120 Opioid dependence, uncomplicated: Secondary | ICD-10-CM

## 2019-11-18 DIAGNOSIS — O0990 Supervision of high risk pregnancy, unspecified, unspecified trimester: Secondary | ICD-10-CM

## 2019-11-18 DIAGNOSIS — Z3A38 38 weeks gestation of pregnancy: Secondary | ICD-10-CM

## 2019-11-18 DIAGNOSIS — O99323 Drug use complicating pregnancy, third trimester: Secondary | ICD-10-CM | POA: Diagnosis present

## 2019-11-18 DIAGNOSIS — F1910 Other psychoactive substance abuse, uncomplicated: Secondary | ICD-10-CM | POA: Diagnosis present

## 2019-11-18 DIAGNOSIS — Z8759 Personal history of other complications of pregnancy, childbirth and the puerperium: Secondary | ICD-10-CM

## 2019-11-18 DIAGNOSIS — Z362 Encounter for other antenatal screening follow-up: Secondary | ICD-10-CM

## 2019-11-18 DIAGNOSIS — Z79899 Other long term (current) drug therapy: Secondary | ICD-10-CM

## 2019-11-18 DIAGNOSIS — O09892 Supervision of other high risk pregnancies, second trimester: Secondary | ICD-10-CM

## 2019-11-18 NOTE — Telephone Encounter (Signed)
Called pt to advise of Induction date, no answer, left VM that some one will be calling with the time to be at MAU on 11/28/19. Sent My Chart message also.

## 2019-11-18 NOTE — Progress Notes (Signed)
Subjective:  Gina Foley is a 34 y.o. [id number] at [redacted]w[redacted]d being seen today for ongoing prenatal care.  She is currently monitored for the following issues for this high-risk pregnancy and has Late prenatal care; Chronic prescription benzodiazepine use; Drug abuse (HCC); Anxiety; ASCUS with positive high risk HPV cervical (negative 16, 18/45) on 08/21/2019; Rubella non-immune status, antepartum; H/O: substance abuse (HCC); ADHD (attention deficit hyperactivity disorder), combined type; Gastroesophageal reflux during pregnancy, antepartum; Posttraumatic stress disorder; Supervision of high risk pregnancy, antepartum; Methadone maintenance treatment affecting pregnancy (HCC); History of gestational hypertension; History of preterm delivery, currently pregnant in second trimester; Depressive disorder; and PTSD (post-traumatic stress disorder) on their problem list.  Patient reports general discomforts of pregnancy.  Contractions: Not present. Vag. Bleeding: None.  Movement: Present. Denies leaking of fluid.   The following portions of the patient's history were reviewed and updated as appropriate: allergies, current medications, past family history, past medical history, past social history, past surgical history and problem list. Problem list updated.  Objective:   Vitals:   11/18/19 1142  BP: 129/79  Pulse: 98    Fetal Status: Fetal Heart Rate (bpm): 134   Movement: Present     General:  Alert, oriented and cooperative. Patient is in no acute distress.  Skin: Skin is warm and dry. No rash noted.   Cardiovascular: Normal heart rate noted  Respiratory: Normal respiratory effort, no problems with respiration noted  Abdomen: Soft, gravid, appropriate for gestational age. Pain/Pressure: Present     Pelvic:  Cervical exam deferred        Extremities: Normal range of motion.  Edema: Trace  Mental Status: Normal mood and affect. Normal behavior. Normal judgment and thought content.   Urinalysis:       Assessment and Plan:  Pregnancy: [id number] at [redacted]w[redacted]d  1. Supervision of high risk pregnancy, antepartum Stable Growth scan today IOL scheduled at 40 weeks  2. Methadone maintenance treatment affecting pregnancy in third trimester (HCC) Stable Followed by Psych  3. History of preterm delivery, currently pregnant in second trimester Stable  4. Chronic prescription benzodiazepine use See adove  5. History of gestational hypertension No S/Sx on today's visit  Term labor symptoms and general obstetric precautions including but not limited to vaginal bleeding, contractions, leaking of fluid and fetal movement were reviewed in detail with the patient. Please refer to After Visit Summary for other counseling recommendations.  Return in about 1 week (around 11/25/2019) for OB visit, face to face, MD provider.   Hermina Staggers, MD

## 2019-11-18 NOTE — Patient Instructions (Signed)

## 2019-11-21 ENCOUNTER — Telehealth (HOSPITAL_COMMUNITY): Payer: Self-pay | Admitting: *Deleted

## 2019-11-21 NOTE — Telephone Encounter (Signed)
Preadmission screen  

## 2019-11-22 ENCOUNTER — Telehealth (HOSPITAL_COMMUNITY): Payer: Self-pay | Admitting: *Deleted

## 2019-11-22 NOTE — Telephone Encounter (Signed)
Preadmission screen  

## 2019-11-23 ENCOUNTER — Inpatient Hospital Stay (HOSPITAL_COMMUNITY)
Admission: AD | Admit: 2019-11-23 | Discharge: 2019-11-26 | DRG: 806 | Disposition: A | Discharge status: Home / Self Care | Payer: Medicaid Other | Attending: Obstetrics & Gynecology | Admitting: Obstetrics & Gynecology

## 2019-11-23 ENCOUNTER — Other Ambulatory Visit: Payer: Self-pay

## 2019-11-23 ENCOUNTER — Encounter (HOSPITAL_COMMUNITY): Payer: Self-pay | Admitting: Obstetrics and Gynecology

## 2019-11-23 DIAGNOSIS — Z3A39 39 weeks gestation of pregnancy: Secondary | ICD-10-CM

## 2019-11-23 DIAGNOSIS — O0933 Supervision of pregnancy with insufficient antenatal care, third trimester: Secondary | ICD-10-CM

## 2019-11-23 DIAGNOSIS — Z79899 Other long term (current) drug therapy: Secondary | ICD-10-CM

## 2019-11-23 DIAGNOSIS — F1190 Opioid use, unspecified, uncomplicated: Secondary | ICD-10-CM | POA: Diagnosis present

## 2019-11-23 DIAGNOSIS — Z87891 Personal history of nicotine dependence: Secondary | ICD-10-CM

## 2019-11-23 DIAGNOSIS — Z20822 Contact with and (suspected) exposure to covid-19: Secondary | ICD-10-CM | POA: Diagnosis present

## 2019-11-23 DIAGNOSIS — O0930 Supervision of pregnancy with insufficient antenatal care, unspecified trimester: Secondary | ICD-10-CM

## 2019-11-23 DIAGNOSIS — F1290 Cannabis use, unspecified, uncomplicated: Secondary | ICD-10-CM | POA: Diagnosis present

## 2019-11-23 DIAGNOSIS — F1910 Other psychoactive substance abuse, uncomplicated: Secondary | ICD-10-CM

## 2019-11-23 DIAGNOSIS — O99324 Drug use complicating childbirth: Secondary | ICD-10-CM | POA: Diagnosis present

## 2019-11-23 DIAGNOSIS — O0943 Supervision of pregnancy with grand multiparity, third trimester: Secondary | ICD-10-CM

## 2019-11-23 DIAGNOSIS — O26893 Other specified pregnancy related conditions, third trimester: Secondary | ICD-10-CM | POA: Diagnosis present

## 2019-11-23 LAB — CBC
HCT: 31.3 % — ABNORMAL LOW (ref 36.0–46.0)
Hemoglobin: 10.2 g/dL — ABNORMAL LOW (ref 12.0–15.0)
MCH: 28 pg (ref 26.0–34.0)
MCHC: 32.6 g/dL (ref 30.0–36.0)
MCV: 86 fL (ref 80.0–100.0)
Platelets: 188 10*3/uL (ref 150–400)
RBC: 3.64 MIL/uL — ABNORMAL LOW (ref 3.87–5.11)
RDW: 14.1 % (ref 11.5–15.5)
WBC: 10.8 10*3/uL — ABNORMAL HIGH (ref 4.0–10.5)
nRBC: 0 % (ref 0.0–0.2)

## 2019-11-23 LAB — SARS CORONAVIRUS 2 (TAT 6-24 HRS): SARS Coronavirus 2: NEGATIVE

## 2019-11-23 LAB — TYPE AND SCREEN
ABO/RH(D): B POS
Antibody Screen: NEGATIVE

## 2019-11-23 LAB — ABO/RH: ABO/RH(D): B POS

## 2019-11-23 MED ORDER — LACTATED RINGERS IV SOLN: 500.0000 mL | INTRAVENOUS | Status: DC | PRN

## 2019-11-23 MED ORDER — SOD CITRATE-CITRIC ACID 500-334 MG/5ML PO SOLN: 30.0000 mL | ORAL | Status: DC | PRN

## 2019-11-23 MED ORDER — LIDOCAINE HCL (PF) 1 % IJ SOLN: 30.0000 mL | INTRAMUSCULAR | Status: DC | PRN

## 2019-11-23 MED ORDER — SENNOSIDES-DOCUSATE SODIUM 8.6-50 MG PO TABS
2.0000 | ORAL_TABLET | ORAL | Status: DC
  Administered 2019-11-23: 2 via ORAL
  Filled 2019-11-23: qty 2

## 2019-11-23 MED ORDER — ONDANSETRON HCL 4 MG PO TABS: 4.0000 mg | ORAL_TABLET | ORAL | Status: DC | PRN

## 2019-11-23 MED ORDER — OXYCODONE-ACETAMINOPHEN 5-325 MG PO TABS: 2.0000 | ORAL_TABLET | ORAL | Status: DC | PRN

## 2019-11-23 MED ORDER — ONDANSETRON HCL 4 MG/2ML IJ SOLN: 4.0000 mg | INTRAMUSCULAR | Status: DC | PRN

## 2019-11-23 MED ORDER — DIBUCAINE (PERIANAL) 1 % EX OINT: 1.0000 "application " | TOPICAL_OINTMENT | CUTANEOUS | Status: DC | PRN

## 2019-11-23 MED ORDER — IBUPROFEN 600 MG PO TABS
600.0000 mg | ORAL_TABLET | Freq: Four times a day (QID) | ORAL | Status: DC | PRN
  Administered 2019-11-23: 600 mg via ORAL
  Filled 2019-11-23: qty 1

## 2019-11-23 MED ORDER — OXYTOCIN 10 UNIT/ML IJ SOLN: 10.0000 [IU] | Freq: Once | INTRAMUSCULAR | Status: AC

## 2019-11-23 MED ORDER — PRENATAL MULTIVITAMIN CH
1.0000 | ORAL_TABLET | Freq: Every day | ORAL | Status: DC
  Administered 2019-11-24 – 2019-11-26 (×3): 1 via ORAL
  Filled 2019-11-23 (×3): qty 1

## 2019-11-23 MED ORDER — ZOLPIDEM TARTRATE 5 MG PO TABS: 5.0000 mg | ORAL_TABLET | Freq: Every evening | ORAL | Status: DC | PRN

## 2019-11-23 MED ORDER — OXYTOCIN 10 UNIT/ML IJ SOLN
INTRAMUSCULAR | Status: AC
  Administered 2019-11-23: 10 [IU] via INTRAMUSCULAR
  Filled 2019-11-23: qty 1

## 2019-11-23 MED ORDER — ACETAMINOPHEN 325 MG PO TABS
650.0000 mg | ORAL_TABLET | ORAL | Status: DC | PRN
  Administered 2019-11-23: 650 mg via ORAL
  Filled 2019-11-23: qty 2

## 2019-11-23 MED ORDER — IBUPROFEN 600 MG PO TABS
600.0000 mg | ORAL_TABLET | Freq: Four times a day (QID) | ORAL | Status: DC
  Administered 2019-11-23 – 2019-11-26 (×11): 600 mg via ORAL
  Filled 2019-11-23 (×11): qty 1

## 2019-11-23 MED ORDER — BENZOCAINE-MENTHOL 20-0.5 % EX AERO
1.0000 "application " | INHALATION_SPRAY | CUTANEOUS | Status: DC | PRN
  Administered 2019-11-24 – 2019-11-26 (×2): 1 via TOPICAL
  Filled 2019-11-23 (×2): qty 56

## 2019-11-23 MED ORDER — ACETAMINOPHEN 325 MG PO TABS
650.0000 mg | ORAL_TABLET | ORAL | Status: DC | PRN
  Administered 2019-11-23 – 2019-11-26 (×9): 650 mg via ORAL
  Filled 2019-11-23 (×9): qty 2

## 2019-11-23 MED ORDER — SIMETHICONE 80 MG PO CHEW: 80.0000 mg | CHEWABLE_TABLET | ORAL | Status: DC | PRN

## 2019-11-23 MED ORDER — DIPHENHYDRAMINE HCL 25 MG PO CAPS: 25.0000 mg | ORAL_CAPSULE | Freq: Four times a day (QID) | ORAL | Status: DC | PRN

## 2019-11-23 MED ORDER — COCONUT OIL OIL
1.0000 "application " | TOPICAL_OIL | Status: DC | PRN
  Administered 2019-11-24: 1 via TOPICAL

## 2019-11-23 MED ORDER — OXYTOCIN BOLUS FROM INFUSION: 500.0000 mL | Freq: Once | INTRAVENOUS | Status: DC

## 2019-11-23 MED ORDER — TETANUS-DIPHTH-ACELL PERTUSSIS 5-2.5-18.5 LF-MCG/0.5 IM SUSP: 0.5000 mL | Freq: Once | INTRAMUSCULAR | Status: DC

## 2019-11-23 MED ORDER — ONDANSETRON HCL 4 MG/2ML IJ SOLN: 4.0000 mg | Freq: Four times a day (QID) | INTRAMUSCULAR | Status: DC | PRN

## 2019-11-23 MED ORDER — METHADONE HCL 10 MG PO TABS: 130.0000 mg | ORAL_TABLET | Freq: Every day | ORAL | Status: DC

## 2019-11-23 MED ORDER — OXYCODONE-ACETAMINOPHEN 5-325 MG PO TABS: 1.0000 | ORAL_TABLET | ORAL | Status: DC | PRN

## 2019-11-23 MED ORDER — LACTATED RINGERS IV SOLN: INTRAVENOUS | Status: DC

## 2019-11-23 MED ORDER — CLONAZEPAM 1 MG PO TABS
1.0000 mg | ORAL_TABLET | Freq: Four times a day (QID) | ORAL | Status: DC | PRN
  Administered 2019-11-23: 1 mg via ORAL
  Filled 2019-11-23: qty 1

## 2019-11-23 MED ORDER — OXYTOCIN 40 UNITS IN NORMAL SALINE INFUSION - SIMPLE MED: 2.5000 [IU]/h | INTRAVENOUS | Status: DC

## 2019-11-23 MED ORDER — WITCH HAZEL-GLYCERIN EX PADS: 1.0000 "application " | MEDICATED_PAD | CUTANEOUS | Status: DC | PRN

## 2019-11-23 NOTE — H&P (Addendum)
OBSTETRIC ADMISSION HISTORY AND PHYSICAL  Gina Foley is a 34 y.o. female [phone] with IUP at [redacted]w[redacted]d presenting for labor. She reports +FMs. No VB, blurry vision, headaches, peripheral edema, or RUQ pain. Reports LOF around 10 am. She plans on breastfeeding. She requests Depo for birth control.  Dating: By 16 wk [place] --->  Estimated Date of Delivery: 11/28/19  Sono:    @[redacted]w[redacted]d , normal anatomy, ceph presentation, 3125g, 30%ile, EFW 6'14  Prenatal History/Complications: - ASCUS w/HRHPV - Hx gHTN in previous pregnancy - Chronic benzodiazepine use - GERD - Hx of PTD - Hx Depression - Hx PTSD - Methadone use - Marijuana use - Limited prenatal care  Past Medical History: Past Medical History:  Diagnosis Date  . Anxiety   . Depression   . Gallstones   . Gonorrhea   . H/O: substance abuse ([place])   . Infection    UTI  . Preterm labor     Past Surgical History: Past Surgical History:  Procedure Laterality Date  . THERAPEUTIC ABORTION    . WISDOM TOOTH EXTRACTION      Obstetrical History: OB History    Gravida  7   Para  4   Term  3   Preterm  1   AB  2   Living  4     SAB      TAB  2   Ectopic      Multiple  0   Live Births  4           Social History: Social History   Socioeconomic History  . Marital status: Significant Other    Spouse name: Not on file  . Number of children: Not on file  . Years of education: Not on file  . Highest education level: Not on file  Occupational History  . Not on file  Tobacco Use  . Smoking status: Former Smoker    Packs/day: 0.25    Types: Cigarettes    Quit date: 06/28/2019    Years since quitting: 0.4  . Smokeless tobacco: Never Used  . Tobacco comment: had cut back, stopped a few wks ago  Substance and Sexual Activity  . Alcohol use: No  . Drug use: Yes    Types: Marijuana, Heroin, Oxycodone    Comment: Methadone- dly; marijuana 3wks ago (oil)  . Sexual activity: Yes    Birth  control/protection: None  Other Topics Concern  . Not on file  Social History Narrative  . Not on file   Social Determinants of Health   Financial Resource Strain:   . Difficulty of Paying Living Expenses:   Food Insecurity: Food Insecurity Present  . Worried About Charity fundraiser in the Last Year: Often true  . Ran Out of Food in the Last Year: Often true  Transportation Needs: No Transportation Needs  . Lack of Transportation (Medical): No  . Lack of Transportation (Non-Medical): No  Physical Activity:   . Days of Exercise per Week:   . Minutes of Exercise per Session:   Stress:   . Feeling of Stress :   Social Connections:   . Frequency of Communication with Friends and Family:   . Frequency of Social Gatherings with Friends and Family:   . Attends Religious Services:   . Active Member of Clubs or Organizations:   . Attends Archivist Meetings:   Marland Kitchen Marital Status:     Family History: Family History  Problem Relation Age of Onset  .  Heart disease Maternal Grandmother   . Stroke Maternal Grandmother     Allergies: No Known Allergies  Medications Prior to Admission  Medication Sig Dispense Refill Last Dose  . aspirin EC 81 MG tablet Take 1 tablet (81 mg total) by mouth daily. Take for prevention of preeclampsia later in pregnancy 300 tablet 2   . clonazePAM (KLONOPIN) 1 MG tablet Take 1 tablet (1 mg total) by mouth 2 (two) times daily as needed for anxiety. 5 tablet 0   . famotidine (PEPCID) 20 MG tablet Take 1 tablet (20 mg total) by mouth 2 (two) times daily. (Patient not taking: Reported on 08/23/2019) 60 tablet 3   . methadone (DOLOPHINE) 10 MG/ML solution Take 140 mg by mouth daily.      Marland Kitchen nystatin cream (MYCOSTATIN) Apply one applicatorful vaginally every night for two weeks (Patient not taking: Reported on 09/25/2019) 30 g 2   . ondansetron (ZOFRAN ODT) 4 MG disintegrating tablet Take 1 tablet (4 mg total) by mouth every 6 (six) hours as needed for  nausea. (Patient not taking: Reported on 08/23/2019) 20 tablet 0   . polyethylene glycol (MIRALAX / GLYCOLAX) 17 g packet Take 17 g by mouth 2 (two) times daily. Take two to three times per day until daily BM and then do once a day 100 each 0   . Prenatal Vit-Fe Fumarate-FA (MULTIVITAMIN-PRENATAL) 27-0.8 MG TABS tablet Take 1 tablet by mouth daily at 12 noon.     . promethazine (PHENERGAN) 25 MG tablet Take 1 tablet (25 mg total) by mouth every 6 (six) hours as needed for nausea or vomiting. 45 tablet 0      Review of Systems:  All systems reviewed and negative except as stated in HPI  PE: Blood pressure (!) 128/105, pulse 98, unknown if currently breastfeeding. General appearance: alert, cooperative and severe distress Lungs: regular effort Abdomen: soft, non-tender FHT: 109 bpm SVE: 10/+1  Prenatal labs: ABO, Rh: B/Positive/-- (12/09 1516) Antibody: Negative (12/09 1516) Rubella: 3.07 (12/09 1516) RPR: Non Reactive (12/09 1516)  HBsAg: Negative (12/09 1516)  HIV: Non Reactive (12/09 1516)  GBS: Negative/-- (02/26 1201)  2 hr GTT not done; A1C 4.8  Prenatal Transfer Tool  Maternal Diabetes: No Genetic Screening: Declined Maternal Ultrasounds/Referrals: Normal Fetal Ultrasounds or other Referrals:  Referred to Materal Fetal Medicine  Maternal Substance Abuse:  Yes:  Type: Marijuana, Prescription drugs, Methadone Significant Maternal Medications:  Meds include: Other:  Clonazepam Significant Maternal Lab Results: None  No results found for this or any previous visit (from the past 24 hour(s)).  Patient Active Problem List   Diagnosis Date Noted  . History of preterm delivery, currently pregnant in second trimester 08/21/2019  . Depressive disorder 08/21/2019  . PTSD (post-traumatic stress disorder) 08/21/2019  . Supervision of high risk pregnancy, antepartum 08/07/2019  . Methadone maintenance treatment affecting pregnancy (HCC) 08/07/2019  . History of gestational  hypertension 08/07/2019  . H/O: substance abuse (HCC)   . Rubella non-immune status, antepartum 05/18/2017  . ASCUS with positive high risk HPV cervical (negative 16, 18/45) on 08/21/2019 04/26/2017  . Late prenatal care 04/14/2017  . Chronic prescription benzodiazepine use 04/14/2017  . Drug abuse (HCC) 04/14/2017  . Anxiety 04/14/2017  . ADHD (attention deficit hyperactivity disorder), combined type 08/25/2015  . Posttraumatic stress disorder 07/22/2013  . Gastroesophageal reflux during pregnancy, antepartum 12/20/2002    Assessment: Gina Foley is a 34 y.o. [id number] at [redacted]w[redacted]d here for active labor  1. Labor: active 2. FWB:  Cat II- limited tracing 3. GBS: neg   Plan: Admit  Prepare for imminent SVD  Donette Larry, CNM  11/23/2019, 1:24 PM

## 2019-11-23 NOTE — Discharge Summary (Signed)
Postpartum Discharge Summary  Date of Service updated 11/26/2019     Patient Name: Gina Foley DOB: May 03, 1986 MRN: [id number]  Date of admission: 11/23/2019 Delivering Provider: Julianne Handler   Date of discharge: 11/26/2019  Admitting diagnosis: Indication for care in labor or delivery [O75.9] Term pregnancy delivered [O80] Intrauterine pregnancy: [redacted]w[redacted]d    Secondary diagnosis:  Active Problems:   Indication for care in labor or delivery   SVD (spontaneous vaginal delivery)   Term pregnancy delivered   Insufficient prenatal care  Additional problems: Opioid use in pregnancy     Discharge diagnosis: Term Pregnancy Delivered                                                                                                Post partum procedures:none  Augmentation: none  Complications: None  Hospital course:  Onset of Labor With Vaginal Delivery     34y.o. yo [id number] admitted in Active Labor on 11/23/2019. Patient had an uncomplicated labor course as follows: arrive and precipitously delivered in MAU Membrane Rupture Time/Date: 10:00 AM ,11/23/2019   Intrapartum Procedures: Episiotomy: None [1]                                         Lacerations:  Labial [10]  Patient had a delivery of a Viable infant. 11/23/2019  Information for the patient's newborn:  CoCarmin, Alvidrez0[[id number]]Delivery Method: Vaginal, Spontaneous(Filed from Delivery Summary)     Pateint had an uncomplicated postpartum course.  She is ambulating, tolerating a regular diet, passing flatus, and urinating well. Patient is discharged home in stable condition on 11/26/19.  Delivery time: 12:58 PM    Magnesium Sulfate received: No BMZ received: No Rhophylac:No MMR:No Transfusion:No  Physical exam  Vitals:   11/25/19 1945 11/25/19 1946 11/26/19 0127 11/26/19 0855  BP:  123/76 107/60 134/77  Pulse:  86 81 89  Resp:  '18 18 18  ' Temp:  98.1 F (36.7 C) 98.5 F (36.9 C) 98.3 F  (36.8 C)  TempSrc:  Oral Oral Oral  SpO2: 100% 100% 100% 100%  Weight:      Height:       General: alert, cooperative and no distress Lochia: appropriate Uterine Fundus: firm Incision: N/A DVT Evaluation: No evidence of DVT seen on physical exam. Labs: Lab Results  Component Value Date   WBC 5.5 11/24/2019   HGB 8.9 (L) 11/24/2019   HCT 26.9 (L) 11/24/2019   MCV 87.6 11/24/2019   PLT 160 11/24/2019   CMP Latest Ref Rng & Units 08/21/2019  Glucose 65 - 99 mg/dL 62(L)  BUN 6 - 20 mg/dL 7  Creatinine 0.57 - 1.00 mg/dL 0.68  Sodium 134 - 144 mmol/L 137  Potassium 3.5 - 5.2 mmol/L 4.3  Chloride 96 - 106 mmol/L 103  CO2 20 - 29 mmol/L 20  Calcium 8.7 - 10.2 mg/dL 9.1  Total Protein 6.0 - 8.5 g/dL 6.5  Total Bilirubin 0.0 - 1.2 mg/dL  0.2  Alkaline Phos 39 - 117 IU/L 62  AST 0 - 40 IU/L 14  ALT 0 - 32 IU/L 6   Edinburgh Score: Edinburgh Postnatal Depression Scale Screening Tool 11/24/2019  I have been able to laugh and see the funny side of things. 0  I have looked forward with enjoyment to things. 0  I have blamed myself unnecessarily when things went wrong. 2  I have been anxious or worried for no good reason. 2  I have felt scared or panicky for no good reason. 0  Things have been getting on top of me. 2  I have been so unhappy that I have had difficulty sleeping. 0  I have felt sad or miserable. 0  I have been so unhappy that I have been crying. 0  The thought of harming myself has occurred to me. 0  Edinburgh Postnatal Depression Scale Total 6    Discharge instruction: per After Visit Summary and "Baby and Me Booklet".  After visit meds:  Allergies as of 11/26/2019   No Known Allergies     Medication List    STOP taking these medications   aspirin EC 81 MG tablet   clonazePAM 1 MG tablet Commonly known as: KLONOPIN   famotidine 20 MG tablet Commonly known as: PEPCID   multivitamin-prenatal 27-0.8 MG Tabs tablet   nystatin cream Commonly known as:  MYCOSTATIN   ondansetron 4 MG disintegrating tablet Commonly known as: Zofran ODT   polyethylene glycol 17 g packet Commonly known as: MIRALAX / GLYCOLAX   promethazine 25 MG tablet Commonly known as: PHENERGAN     TAKE these medications   ibuprofen 600 MG tablet Commonly known as: ADVIL Take 1 tablet (600 mg total) by mouth every 6 (six) hours.   methadone 10 MG/ML solution Commonly known as: DOLOPHINE Take 140 mg by mouth daily.       Diet: No evidence of DVT seen on physical exam.  Activity: Advance as tolerated. Pelvic rest for 6 weeks.   Outpatient follow up:2 weeks Follow up Appt: Future Appointments  Date Time Provider [place]  12/25/2019  8:55 AM Donnamae Jude, MD [facility]   Follow up Visit: [employer] for [facility] Follow up in 4 week(s).   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

## 2019-11-23 NOTE — MAU Note (Signed)
At 12 minutes of life, Infant became blue and apneic while skin to skin with mother. NRP started and Code Apgar called.   Infant to NICU.

## 2019-11-24 DIAGNOSIS — O0930 Supervision of pregnancy with insufficient antenatal care, unspecified trimester: Secondary | ICD-10-CM

## 2019-11-24 LAB — CBC
HCT: 26.9 % — ABNORMAL LOW (ref 36.0–46.0)
Hemoglobin: 8.9 g/dL — ABNORMAL LOW (ref 12.0–15.0)
MCH: 29 pg (ref 26.0–34.0)
MCHC: 33.1 g/dL (ref 30.0–36.0)
MCV: 87.6 fL (ref 80.0–100.0)
Platelets: 160 10*3/uL (ref 150–400)
RBC: 3.07 MIL/uL — ABNORMAL LOW (ref 3.87–5.11)
RDW: 14.4 % (ref 11.5–15.5)
WBC: 5.5 10*3/uL (ref 4.0–10.5)
nRBC: 0 % (ref 0.0–0.2)

## 2019-11-24 LAB — RPR: RPR Ser Ql: NONREACTIVE

## 2019-11-24 LAB — RAPID URINE DRUG SCREEN, HOSP PERFORMED
Amphetamines: NOT DETECTED
Barbiturates: NOT DETECTED
Benzodiazepines: POSITIVE — AB
Cocaine: NOT DETECTED
Opiates: NOT DETECTED
Tetrahydrocannabinol: POSITIVE — AB

## 2019-11-24 MED ORDER — CLONAZEPAM 0.25 MG PO TBDP
1.0000 mg | ORAL_TABLET | Freq: Four times a day (QID) | ORAL | Status: DC | PRN
  Administered 2019-11-24 – 2019-11-26 (×9): 1 mg via ORAL
  Filled 2019-11-24 (×9): qty 4

## 2019-11-24 MED ORDER — METHADONE HCL 10 MG PO TABS
140.0000 mg | ORAL_TABLET | Freq: Every day | ORAL | Status: DC
  Administered 2019-11-24 – 2019-11-26 (×3): 140 mg via ORAL
  Filled 2019-11-24 (×3): qty 14

## 2019-11-24 MED ORDER — POLYSACCHARIDE IRON COMPLEX 150 MG PO CAPS
150.0000 mg | ORAL_CAPSULE | Freq: Every day | ORAL | Status: DC
  Administered 2019-11-25 – 2019-11-26 (×2): 150 mg via ORAL
  Filled 2019-11-24 (×2): qty 1

## 2019-11-24 MED ORDER — CLONAZEPAM 1 MG PO TABS: 1.0000 mg | ORAL_TABLET | Freq: Four times a day (QID) | ORAL | Status: DC | PRN

## 2019-11-24 MED ORDER — SENNOSIDES-DOCUSATE SODIUM 8.6-50 MG PO TABS
2.0000 | ORAL_TABLET | Freq: Every evening | ORAL | Status: DC | PRN
  Administered 2019-11-24: 2 via ORAL
  Filled 2019-11-24: qty 2

## 2019-11-24 MED ORDER — POLYETHYLENE GLYCOL 3350 17 G PO PACK
17.0000 g | PACK | Freq: Two times a day (BID) | ORAL | Status: DC
  Administered 2019-11-24 – 2019-11-25 (×3): 17 g via ORAL
  Filled 2019-11-24 (×4): qty 1

## 2019-11-24 NOTE — Lactation Note (Addendum)
This note was copied from a baby's chart. Lactation Consultation Note  Patient Name: Gina Foley Today's Date: 11/24/2019  mom is a p5 and reports she breastfeed all of her babies a few months but her milk always dried up. This is dads first baby. Mom has hx of drug use and is currently on klonopin and methadone. Infants unrine tox screen negative. Mom reports she is on [facility] in [place].  Mom reports she previously had a baby in NICU that she pumped for and got a loaner breastpump from [facility]. Mom reports she was given a used medela DEBP breastpump, and that they got a  Spectra pump new in box but that she would really like to have the money for other things and had thought about taking the pump back in exchange for clothes and diapers. Discussed obtaining DEBP through [facility].  Referral sent.  Urged to pump 8-12 times day for 15 minutes. Urged parents to call lactation as needed.   Maternal Data    Feeding Feeding Type: Bottle Fed - Formula Nipple Type: Nfant Extra Slow Flow (gold)  LATCH Score                   Interventions    Lactation Tools Discussed/Used     Consult Status      Gina Foley Michaelle Copas 11/24/2019, 3:53 PM

## 2019-11-24 NOTE — Progress Notes (Signed)
Daily Postpartum Note  Admission Date: 11/23/2019 Current Date: 11/24/2019 9:56 AM  Gina Foley is a 34 y.o. [id number], PPD#1 SVD/intact perineum @ 39/3, admitted for .  Pregnancy complicated by: Patient Active Problem List   Diagnosis Date Noted  . Insufficient prenatal care 11/24/2019  . Indication for care in labor or delivery 11/23/2019  . SVD (spontaneous vaginal delivery) 11/23/2019  . Term pregnancy delivered 11/23/2019  . History of preterm delivery, currently pregnant in second trimester 08/21/2019  . Depressive disorder 08/21/2019  . PTSD (post-traumatic stress disorder) 08/21/2019  . Supervision of high risk pregnancy, antepartum 08/07/2019  . 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 History of gestational hypertension 08/07/2019  . H/O: substance abuse ([place])   . Rubella non-immune status, antepartum 05/18/2017  . ASCUS with positive high risk HPV cervical (negative 16, 18/45) on 08/21/2019 04/26/2017  . Late prenatal care 04/14/2017  . Chronic prescription benzodiazepine use 04/14/2017  . Drug abuse ([place]) 04/14/2017  . Anxiety 04/14/2017  . ADHD (attention deficit hyperactivity disorder), combined type 08/25/2015  . Posttraumatic stress disorder 07/22/2013  . Gastroesophageal reflux during pregnancy, antepartum 12/20/2002    Overnight/24hr events:  none  Subjective:  Meeting all pp goals  Objective:    Current Vital Signs 24h Vital Sign Ranges  T 98.1 F (36.7 C) Temp  Avg: 98.2 F (36.8 C)  Min: 98 F (36.7 C)  Max: 98.4 F (36.9 C)  BP 134/72 BP  Min: 101/60  Max: 138/79  HR 78 Pulse  Avg: 82.2  Min: 64  Max: 98  RR 18 Resp  Avg: 18.7  Min: 17  Max: 22  SaO2 100 %   SpO2  Avg: 95.8 %  Min: 73 %  Max: 100 %       24 Hour I/O Current Shift I/O  Time Ins Outs No intake/output data recorded. No intake/output data recorded.   Patient Vitals for the past 24 hrs:  BP Temp Temp src Pulse Resp SpO2 Height Weight   11/24/19 0826 134/72 98.1 F (36.7 C) Oral 78 18 100 % -- --  11/24/19 0825 -- -- -- -- -- 100 % -- --  11/24/19 0419 101/60 98.4 F (36.9 C) Oral 81 18 100 % -- --  11/23/19 2343 125/64 -- -- 82 -- -- -- --  11/23/19 2200 -- -- -- -- -- -- 5\' 3"  (1.6 m) 90.7 kg  11/23/19 1942 113/60 98.2 F (36.8 C) Oral 80 17 99 % -- --  11/23/19 1941 (!) 108/57 -- -- 79 -- -- -- --  11/23/19 1529 138/79 98.1 F (36.7 C) Oral 81 18 98 % -- --  11/23/19 1500 122/84 -- -- 79 -- -- -- --  11/23/19 1445 124/71 -- -- 83 -- -- -- --  11/23/19 1431 118/65 -- -- 82 -- -- -- --  11/23/19 1423 (!) 127/106 -- -- 64 -- -- -- --  11/23/19 1401 126/85 -- -- 81 -- -- -- --  11/23/19 1346 136/86 -- -- 83 -- -- -- --  11/23/19 1344 -- -- -- -- -- 98 % -- --  11/23/19 1343 -- 98 F (36.7 C) Oral -- 19 -- -- --  11/23/19 1331 132/79 -- -- 83 -- -- -- --  11/23/19 1326 136/84 -- -- 84 -- -- -- --  11/23/19 1311 (!) 128/105 -- -- 98 -- -- -- --  11/23/19 1307 (!) 122/104 -- -- 97 (!) 22 -- -- --  11/23/19 1305 -- -- -- -- --  98 % -- --  11/23/19 1300 -- -- -- -- -- (!) 73 % -- --    Physical exam: General: Well nourished, well developed female in no acute distress. Abdomen: gravid nttp Respiratory: No respiratory distress Extremities: no clubbing, cyanosis or edema Skin: Warm and dry.   Medications: Current Facility-Administered Medications  Medication Dose Route Frequency Provider Last Rate Last Admin  . acetaminophen (TYLENOL) tablet 650 mg  650 mg Oral Q4H PRN Rolm Bookbinder, CNM   650 mg at 11/23/19 2338  . benzocaine-Menthol (DERMOPLAST) 20-0.5 % topical spray 1 application  1 application Topical PRN Rolm Bookbinder, CNM   1 application at 11/24/19 [phone]  . clonazepam (KLONOPIN) disintegrating tablet 1 mg  1 mg Oral QID PRN Catalina Pizza, MD   1 mg at 11/24/19 0615  . coconut oil  1 application Topical PRN Rolm Bookbinder, CNM   1 application at 11/24/19 [phone]  . witch hazel-glycerin (TUCKS) pad  1 application  1 application Topical PRN Rolm Bookbinder, CNM       And  . dibucaine (NUPERCAINAL) 1 % rectal ointment 1 application  1 application Rectal PRN Rolm Bookbinder, CNM      . diphenhydrAMINE (BENADRYL) capsule 25 mg  25 mg Oral Q6H PRN Cleone Slim M, CNM      . ibuprofen (ADVIL) tablet 600 mg  600 mg Oral Q6H Cleone Slim [place], [place]   600 mg at 11/24/19 2831  . methadone (DOLOPHINE) tablet 140 mg  140 mg Oral Daily [place] Bing, MD   140 mg at 11/24/19 0943  . ondansetron (ZOFRAN) tablet 4 mg  4 mg Oral Q4H PRN Rolm Bookbinder, CNM       Or  . ondansetron [facility]) injection 4 mg  4 mg Intravenous Q4H PRN Rolm Bookbinder, CNM      . polyethylene glycol (MIRALAX / GLYCOLAX) packet 17 g  17 g Oral BID [place] Bing, MD      . prenatal multivitamin tablet 1 tablet  1 tablet Oral Q1200 Rolm Bookbinder, CNM      . senna-docusate (Senokot-S) tablet 2 tablet  2 tablet Oral QHS PRN [place] Bing, MD      . simethicone (MYLICON) chewable tablet 80 mg  80 mg Oral PRN Rolm Bookbinder, CNM      . Tdap (BOOSTRIX) injection 0.5 mL  0.5 mL Intramuscular Once Rolm Bookbinder, [place]        Labs:  Recent Labs  Lab 11/23/19 1454 11/24/19 0526  WBC 10.8* 5.5  HGB 10.2* 8.9*  HCT 31.3* 26.9*  PLT 188 160   Conflict (See Lab Report): B POS/B POS Performed at [facility], [address], [place], [place] [address]   Radiology:  none  Assessment & Plan:  Pt doing well *PP: routine care. Breast. Boy. D/w pt more re: [facility] tomorrow *Methadone: pt followed at [facility], which is closed on Sunday. Pt showed me Rx bottle from that that shows 140mg  solution daily.  -follow up SW consult -follow up screening UDS *Abnormal pap: needs PP colpo *Dispo: likely tomorrow.   MD Attending Center for [facility])

## 2019-11-25 ENCOUNTER — Encounter (HOSPITAL_COMMUNITY): Payer: Self-pay | Admitting: Obstetrics and Gynecology

## 2019-11-25 ENCOUNTER — Encounter: Payer: Self-pay | Admitting: Family Medicine

## 2019-11-25 NOTE — Progress Notes (Signed)
Post Partum Day 2  Subjective: no complaints  Objective: Blood pressure (!) 129/59, pulse 76, temperature 97.8 F (36.6 C), temperature source Oral, resp. rate 17, height 5\' 3"  (1.6 m), weight 90.7 kg, SpO2 100 %, unknown if currently breastfeeding.  Physical Exam:  General: alert, cooperative and no distress Lochia: appropriate Uterine Fundus: firm Incision: n/a DVT Evaluation: No evidence of DVT seen on physical exam.  Recent Labs    11/23/19 1454 11/24/19 0526  HGB 10.2* 8.9*  HCT 31.3* 26.9*    Assessment/Plan: Plan for discharge tomorrow and Social Work consult   LOS: 2 days   11/26/19 11/25/2019, 8:59 AM

## 2019-11-25 NOTE — Clinical Social Work Maternal (Addendum)
CLINICAL SOCIAL WORK MATERNAL/CHILD NOTE  Patient Details  Name: Gina Foley MRN: [id number] Date of Birth: 07-06-1986  Date:  11/25/2019  Clinical Social Worker Initiating Note:  Laurey Arrow Date/Time: Initiated:  11/25/19/1459     Child's Name:  Gina Foley "EJ"   Biological Parents:  Mother, Father   Need for Interpreter:  None   Reason for Referral:  Behavioral Health Concerns, Current Substance Use/Substance Use During Pregnancy , Other (Comment)(MOB is also on medication managment.)   Address:  [place] [address]    Phone number:  [phone] (home)     Additional phone number: [phone]  Household Members/Support Persons (HM/SP):   Household Member/Support Person 1, Household Member/Support Person 2, Household Member/Support Person 3, Household Member/Support Person 4, Household Member/Support Person 5(MOB reported that she and FOB resides with FOB's parents in [place]. MOB also reported that MOB's older 4 children resides with MOB's mother.)   HM/SP Name Relationship DOB or Age  HM/SP -1 Gina Foley FOB 03/24/1986  HM/SP -2 Gina Foley son 04/17/08  HM/SP -3 Gina Foley son 07/03/2012  HM/SP -4 Gina Foley daughter 12/03/2015  HM/SP -5 Gina Foley daughter 05/23/2017  HM/SP -6        HM/SP -7        HM/SP -8          Natural Supports (not living in the home):  Extended Family, Immediate Family, Parent   Professional Supports: Case Metallurgist, Transport planner   Employment: Unemployed   Type of Work:     Education:  9 to 11 years   Homebound arranged: No  Financial Resources:  [employer]   Other Resources:  Location manager provided MOB with information to apply for [employer].)   Cultural/Religious Considerations Which May Impact Care:  None Reported.  Strengths:  Ability to meet basic needs , Pediatrician chosen, Home prepared for child , Compliance with medical plan , Understanding of illness, Psychotropic  Medications   Psychotropic Medications:  Klonopin, Methadone      Pediatrician:    [place]  Pediatrician List:   [facility] [place]      Pediatrician Fax Number:    Risk Factors/Current Problems:  Mental Health Concerns , Substance Use    Cognitive State:  Alert , Able to Concentrate , Insightful , Goal Oriented , Linear Thinking    Mood/Affect:  Interested , Calm , Comfortable , Tearful , Relaxed    CSW Assessment: CSW met with MOB in room 108 to complete an assessment for SA hx and MH hx. When CSW arrived, MOB was in bed pumping and FOB was assisting with breast pump.  CSW offered to return at a later time and MOB declined. CSW asked FOB to step out of the room in order to assess MOB in private; FOB without incident. MOB was polite, honest, easy to engage, and receptive to meeting with CSW.   CSW asked about MOB's thoughts and feeling regarding infant's NICU admission.  MOB reported feeling nervous and scared initially and shared there post delivery experience that resulted to infant being admitted to the NICU.  MOB became tearful and reported, "The nurse pushed the panic button because EJ was not birthing and I just went to pieces." CSW validated and normalized MOB's thoughts and feelings. CSW also shared other emotions that MOB may experience  during the postpartum period. CSW provided education regarding the baby blues period vs. perinatal mood disorders, discussed treatment and gave resources for mental health follow up if concerns arise.  CSW recommends self-evaluation during the postpartum time period using the New Mom Checklist from Postpartum Progress and encouraged MOB to contact a medical professional if symptoms are noted at any time. MOB presented with insight and awareness and did not display any acute MH symptoms. MOB reported having PMAD  symptoms with baby #3 and shared feeling comfortable seeking help if needed.  Per MOB, after experiencing PMAD symptoms with Baby #3 MOB was prescribed Cymbalta and her symptoms subsides.  MOB reported she discontinued using the medication after her symptoms subsided.  CSW assessed for safety and MOB denied SI, HI, and DV.  MOB also shared having good support team that is aware of her hx. MOB also shared having an active Rx for klonopin to assess with her MH.   CSW asked about MOB's MH hx. MOB reported currently being treated with Methadone and reported that her medication is managed my [employer] daily. MOB reported that she begin treatment in 2009 and has been consistent. MOB also admitted to using  "Delta 8." MOB shared, "Delta 8 is a legal substance that is purchased at a smoke shop that has very low levels of THC." Per MOB, MOB used to help her sleep at night. CSW reminded MOB that [facility] is illegal in the state of [place] and informed her of the hospital's substance exposure policy.  MOB was understanding and denied having any questions or concerns.  MOB also denied the use of all of illicit substances and CPS hx. MOB acknowledged that MOB's older 4 children are currently not in her care (Per MOB the older four children resides with MOB's mother, Junnie Loschiavo [phone]). Infant's UDS is negative and CSW will continue to monitor infant's CDS and make a report to [place] if warranted.   MOB shared that FOB is also an established patient at [facility] and receives treatment daily.   MOB denied barriers to follow-up appointment for infant post discharge and she also denied barriers to visiting with infant after she discharges. MOB reported having all essential items for infant including a used unexpired car seat and a crib. MOB requested meal vouchers and gas cards to assist with financial hardship. CSW agreed to assist MOB post discharge for MOB.  CSW provided review of Sudden Infant  Death Syndrome (SIDS) precautions.    CSW will continue to offer resources and supports to family while infant remains in NICU.    CSW Plan/Description:  Psychosocial Support and Ongoing Assessment of Needs, Sudden Infant Death Syndrome (SIDS) Education, Perinatal Mood and Anxiety Disorder (PMADs) Education, Neonatal Abstinence Syndrome (NAS) Education, Other Patient/Family Education, [place], CSW Will Continue to Monitor Umbilical Cord Tissue Drug Screen Results and Make Report if Warranted   Laurey Arrow, MSW, LCSW Clinical Social Work [phone]  Dimple Nanas, LCSW 11/25/2019, 3:56 PM

## 2019-11-25 NOTE — Lactation Note (Signed)
This note was copied from a baby's chart. Lactation Consultation Note  Patient Name: Boy Gaye Scorza IFOYD'X Date: 11/25/2019 Reason for consult: Follow-up assessment  P4 mother whose infant is now 72 hours old.  This is a term baby at 39+2 weeks and in the NICU.  Per NP note, mother has a history of drug abuse and is being treated in the methadone clinic.  A code apgar was called 15 minutes after delivery for apnea.  Baby is currently on room air in an open crib.    Mother had just awakened when I arrived.  She has a DEBP set up at bedside and had no questions related to pumping.  As of now, she has not been consistent with pumping.  Strongly encouraged her to pump every 2 1/2-3 hours today to help stimulate breasts.  Mother is planning on doing this.  Discussed hand expression before/after pumping and breast massage during pumping.  Father has been assisting with this.  Mother has a sports bra here and I informed her about making a "hands free" bra with her sports bra if desired.  Mother seemed interested in trying this later.  Suggested she take her pump parts to the NICU when she visits her son and to pump at baby's bedside.  Mother was going to do this yesterday but "forgot."  Encouraged her to call me for any further questions/concerns.    Previous LC sent a [facility] referral and mother is familiar with the [facility] loaner program; she has used this in the past.  Father present.  She will call as needed.  RN in room at the end of my visit.   Maternal Data    Feeding Feeding Type: Formula  LATCH Score                   Interventions    Lactation Tools Discussed/Used     Consult Status Consult Status: Follow-up Date: 11/26/19 Follow-up type: In-patient    Jalen Daluz R Thoren Hosang 11/25/2019, 8:10 AM

## 2019-11-26 ENCOUNTER — Other Ambulatory Visit (HOSPITAL_COMMUNITY): Admission: RE | Admit: 2019-11-26 | Payer: Self-pay | Source: Ambulatory Visit

## 2019-11-26 ENCOUNTER — Ambulatory Visit: Payer: Self-pay

## 2019-11-26 MED ORDER — IBUPROFEN 600 MG PO TABS: 600.0000 mg | ORAL_TABLET | Freq: Four times a day (QID) | ORAL | 0 refills | Status: AC

## 2019-11-26 MED ORDER — MEDROXYPROGESTERONE ACETATE 150 MG/ML IM SUSP
150.0000 mg | Freq: Once | INTRAMUSCULAR | Status: AC
  Administered 2019-11-26: 150 mg via INTRAMUSCULAR
  Filled 2019-11-26: qty 1

## 2019-11-26 NOTE — Lactation Note (Signed)
This note was copied from a baby's chart. Lactation Consultation Note: Attempt to visit mother in NICU and mother has left the hospital to go home.   Patient Name: Gina Foley [id number] Date: 11/26/2019     Maternal Data    Feeding Feeding Type: Formula Nipple Type: Nfant Extra Slow Flow (gold)  LATCH Score                   Interventions    Lactation Tools Discussed/Used     Consult Status      Michel Bickers 11/26/2019, 12:40 PM

## 2019-11-26 NOTE — Discharge Instructions (Signed)

## 2019-11-28 ENCOUNTER — Inpatient Hospital Stay (HOSPITAL_COMMUNITY): Payer: Self-pay

## 2019-11-28 ENCOUNTER — Encounter: Payer: Self-pay | Admitting: Family Medicine

## 2019-11-28 ENCOUNTER — Inpatient Hospital Stay (HOSPITAL_COMMUNITY): Admission: AD | Admit: 2019-11-28 | Payer: Self-pay | Source: Home / Self Care | Admitting: Obstetrics and Gynecology

## 2019-12-02 ENCOUNTER — Other Ambulatory Visit: Payer: Self-pay

## 2019-12-02 ENCOUNTER — Encounter: Payer: Self-pay | Admitting: Family Medicine

## 2019-12-25 ENCOUNTER — Ambulatory Visit: Payer: Self-pay | Admitting: Family Medicine

## 2019-12-25 ENCOUNTER — Telehealth: Payer: Self-pay | Admitting: Family Medicine

## 2019-12-25 ENCOUNTER — Encounter: Payer: Self-pay | Admitting: Family Medicine

## 2019-12-25 NOTE — Telephone Encounter (Signed)
Attempted to contact patient to get her rescheduled for her missed postpartum visit. No answer, left voicemail for the patient to give the office a call back to be rescheduled.

## 2019-12-25 NOTE — Progress Notes (Signed)
Patient did not keep appointment today. She will be called to reschedule.  

## 2020-02-02 ENCOUNTER — Ambulatory Visit: Payer: Self-pay

## 2020-02-02 NOTE — Lactation Note (Signed)
This note was copied from a baby's chart. Lactation Consultation Note  Patient Name: Gina Foley Date: 02/02/2020   Lactation called RN on duty today to check on status of this patient to update lactation consult status. Based on information provided, I am placing this patient on PRN status for lactation follow up. RN reports some social issues occurring with the patient's family and changes in custody for baby, and baby is currently receiving 24 kcal formula. LC will follow up with patient upon request.   Consult Status Consult Status: PRN Follow-up type: Other (comment)(See lactation comment on greaseboard)    Walker Shadow 02/02/2020, 12:32 PM

## 2021-04-07 IMAGING — US US MFM OB DETAIL+14 WK
1 series · 13 of 28 positions shown · non-contrast
Comparison: none

[Series 1: us mfm ob detail+14 wk · 105 acquisitions, 13 frames shown]
[im 4/105]
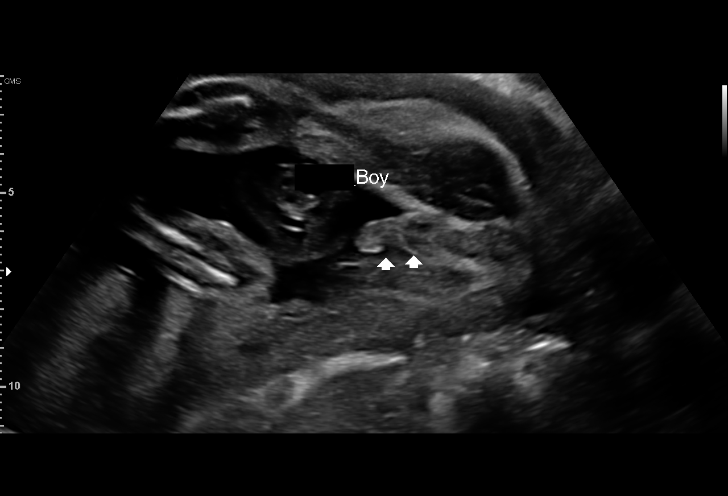
[im 12/105]
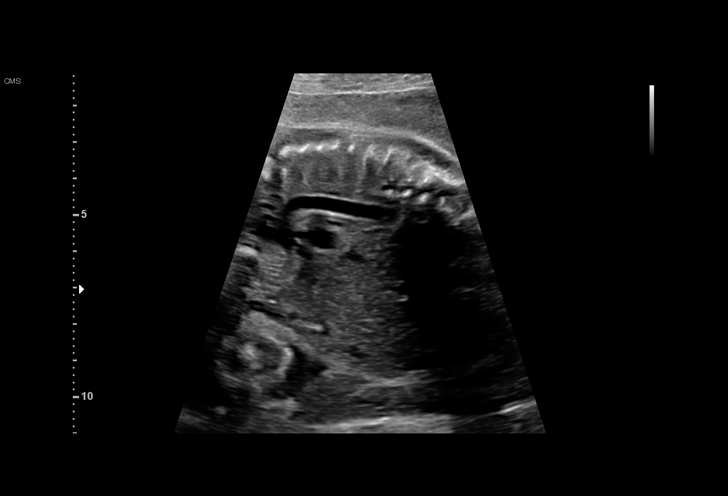
[im 20/105]
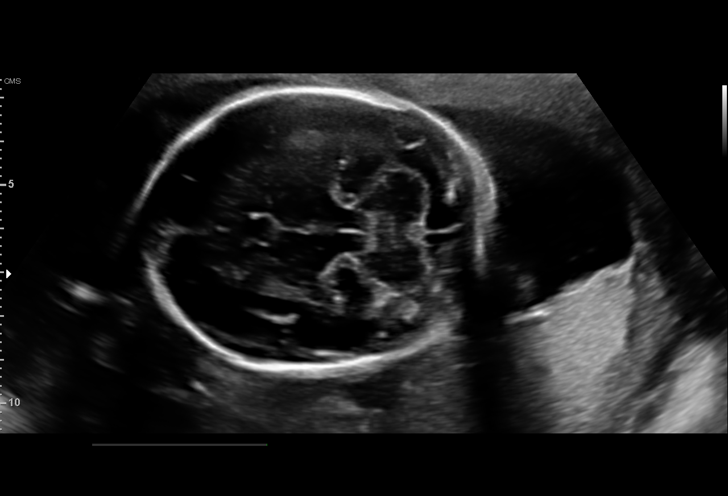
[im 27/105]
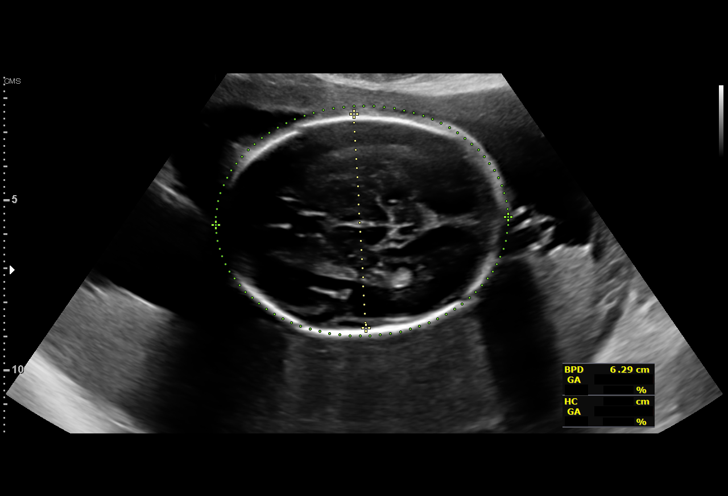
[im 35/105]
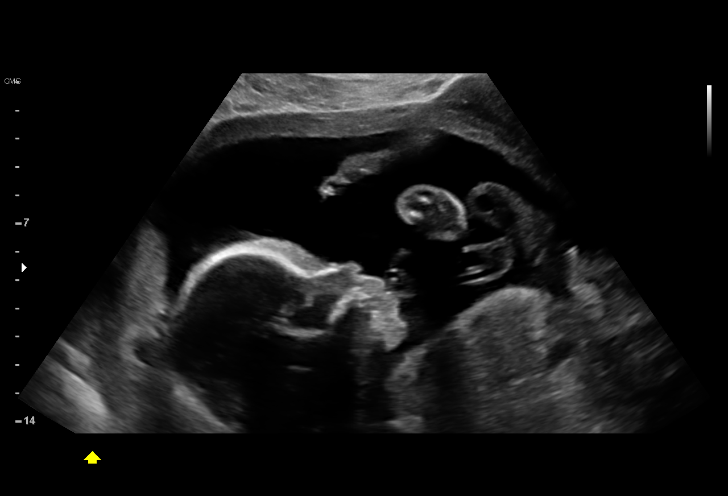
[im 43/105]
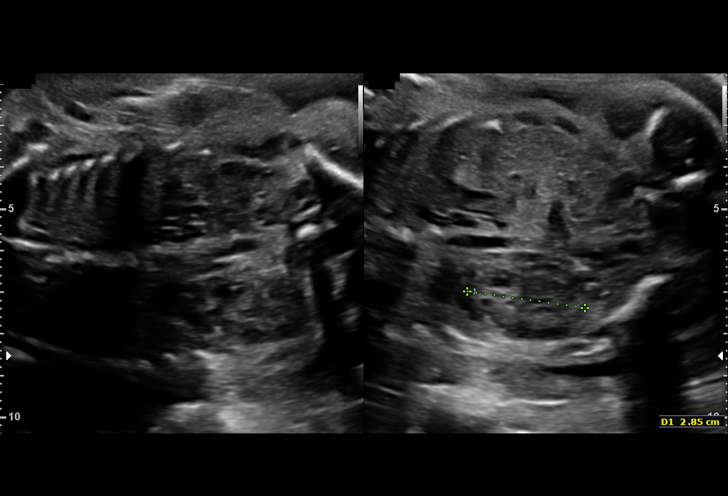
[im 54/105]
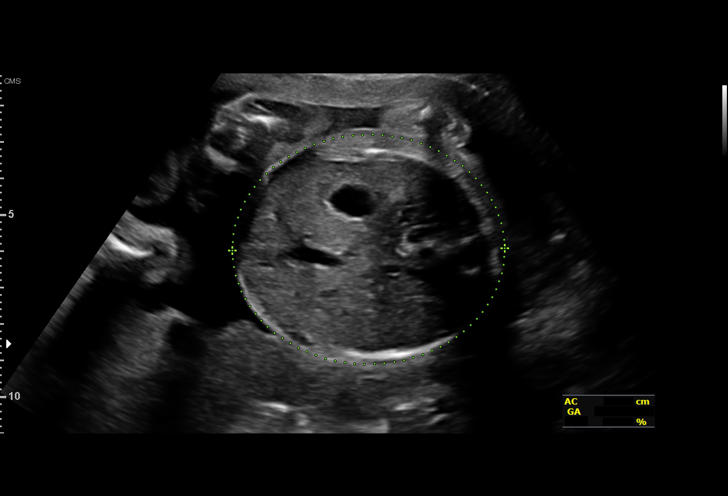
[im 62/105]
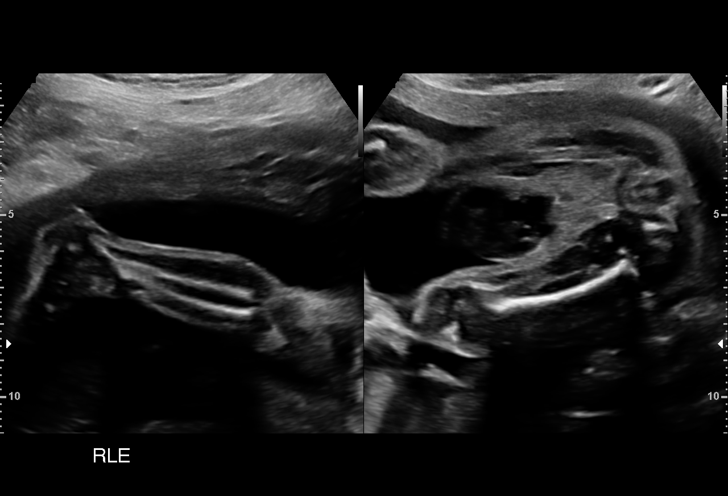
[im 70/105]
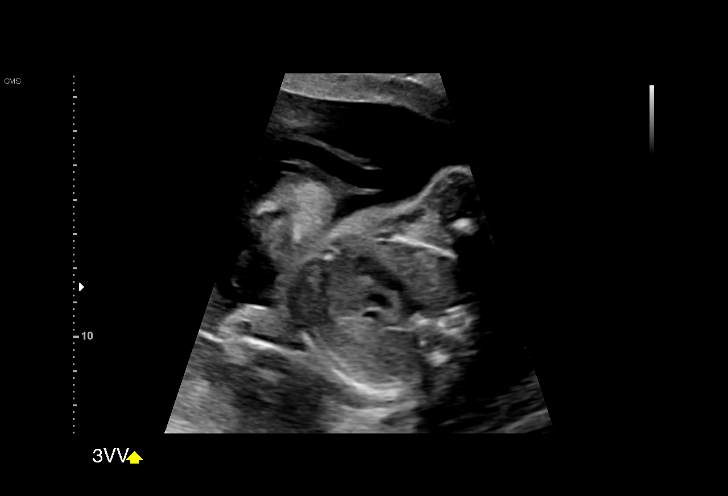
[im 78/105]
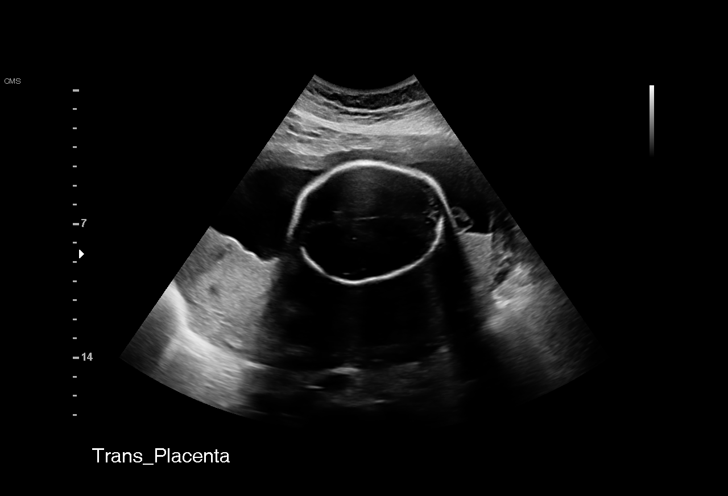
[im 85/105]
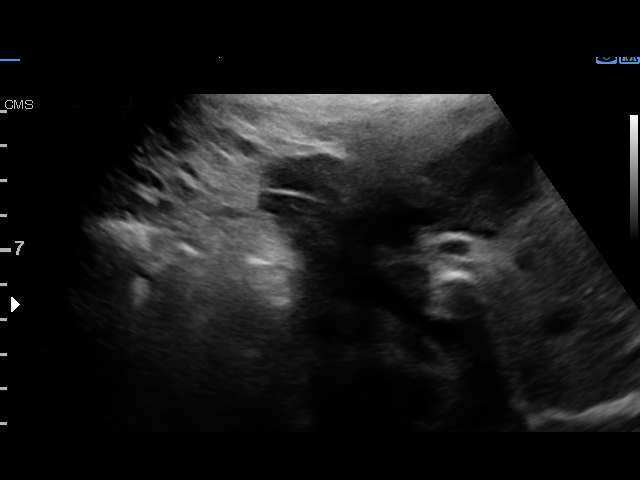
[im 93/105]
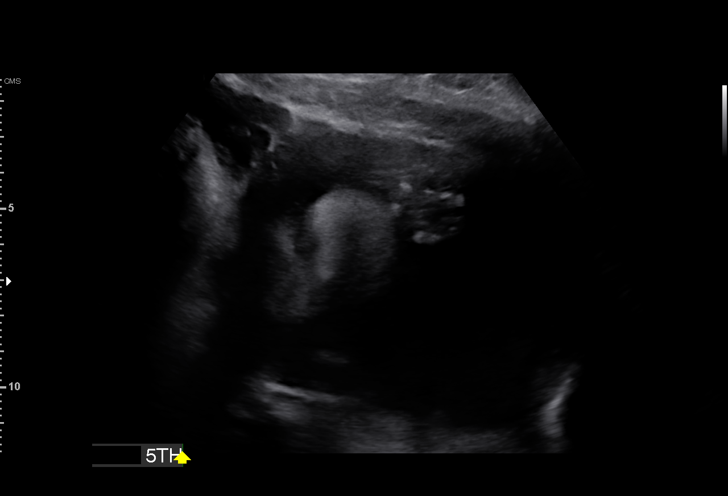
[im 101/105]
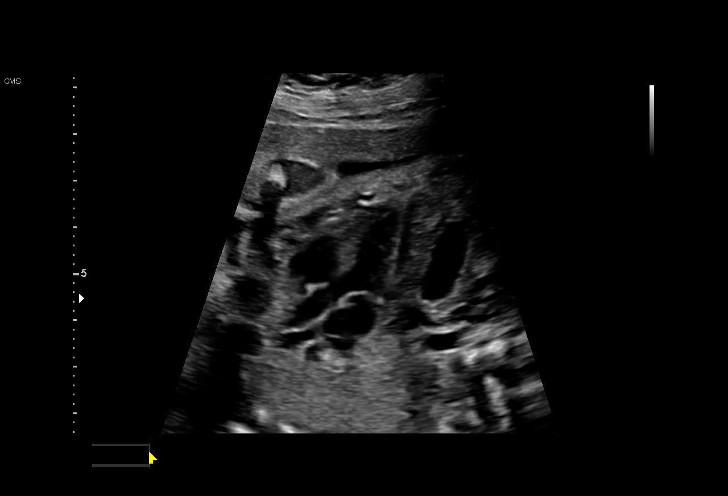

[13 of 28 positions shown; findings below may reference images not displayed]

[address]

 ----------------------------------------------------------------------

 ----------------------------------------------------------------------
Indications

  Antenatal screening for malformations
  Drug dependence complicating pregnancy,
  antepartum condition or complication
  (methadone)
  Poor obstetric history: Previous preterm
  delivery, antepartum
  26 weeks gestation of pregnancy
 ----------------------------------------------------------------------
Fetal Evaluation

 Num Of Fetuses:         1
 Fetal Heart Rate(bpm):  133
 Cardiac Activity:       Observed
 Presentation:           Variable
 Placenta:               Posterior
 P. Cord Insertion:      Visualized

 Amniotic Fluid
 AFI FV:      Within normal limits

                             Largest Pocket(cm)

Biometry

 BPD:        63  mm     G. Age:  25w 4d         20  %    CI:        69.81   %    70 - 86
                                                         FL/HC:      18.2   %    18.6 -
 HC:      240.6  mm     G. Age:  26w 1d         24  %    HC/AC:      1.10        1.04 -
 AC:      218.9  mm     G. Age:  26w 2d         48  %    FL/BPD:     69.5   %    71 - 87
 FL:       43.8  mm     G. Age:  24w 3d          3  %    FL/AC:      20.0   %    20 - 24
 HUM:      39.7  mm     G. Age:  24w 1d        < 5  %
 CER:      29.6  mm     G. Age:  26w 2d         54  %

 CM:        8.5  mm
 Est. FW:     828  gm    1 lb 13 oz      19  %
OB History

 Gravidity:    7         Term:   3        Prem:   1        SAB:   0
 TOP:          2       Ectopic:  0        Living: 4
Gestational Age

 U/S Today:     25w 4d                                        EDD:   12/02/19
 Best:          26w 1d     Det. By:  Previous Ultrasound      EDD:   11/28/19
                                     (06/15/19)
Anatomy

 Cranium:               Appears normal         Aortic Arch:            Appears normal
 Cavum:                 Appears normal         Ductal Arch:            Appears normal
 Ventricles:            Appears normal         Diaphragm:              Appears normal
 Choroid Plexus:        Appears normal         Stomach:                Appears normal, left
                                                                       sided
 Cerebellum:            Appears normal         Abdomen:                Appears normal
 Posterior Fossa:       Appears normal         Abdominal Wall:         Appears nml (cord
                                                                       insert, abd wall)
 Nuchal Fold:           Not applicable (>20    Cord Vessels:           Appears normal (3
                        wks GA)                                        vessel cord)
 Face:                  Appears normal         Kidneys:                Appear normal
                        (orbits and profile)
 Lips:                  Appears normal         Bladder:                Appears normal
 Thoracic:              Appears normal         Spine:                  Appears normal
 Heart:                 Appears normal         Upper Extremities:      Appears normal
                        (4CH, axis, and
                        situs)
 RVOT:                  Appears normal         Lower Extremities:      Appears normal
 LVOT:                  Appears normal

 Other:  Heels/feet and open hands/5th digits visualized. Nasal bone
         visualized.
Cervix Uterus Adnexa

 Cervix
 Length:            3.5  cm.
 Normal appearance by transabdominal scan.

 Uterus
 No abnormality visualized.

 Left Ovary
 Not visualized.

 Right Ovary
 Not visualized.

 Cul De Sac
 No free fluid seen.

 Adnexa
 No abnormality visualized.
Impression

 G7 P4.  Pregnancy is dated by 16-week ultrasound
 performed at [REDACTED].  Patient is on methadone
 maintenance and takes 140 mg daily.  She does not report
 withdrawal symptoms after recent increase in dosage.
 She has not had screening for fetal aneuploidies.

 Fetal growth is appropriate for gestational age. Amniotic fluid
 is normal and good fetal activity is seen. Fetal anatomy
 appears normal, but limited by advanced gestational age.
 We reassured the patient of the findings.
Recommendations

 -An appointment was made for her to return in 4 weeks for
 fetal growth assessment.
                 Gay

## 2021-05-10 IMAGING — US US MFM OB FOLLOW-UP
1 series · 13 of 26 positions shown · non-contrast
Comparison: none

[Series 1: us mfm ob follow-up · 13 of 26 slices shown]
[im 2/26]
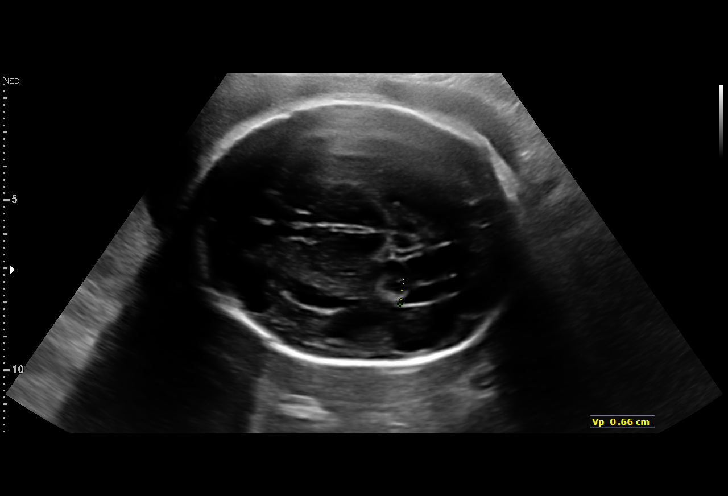
[im 4/26]
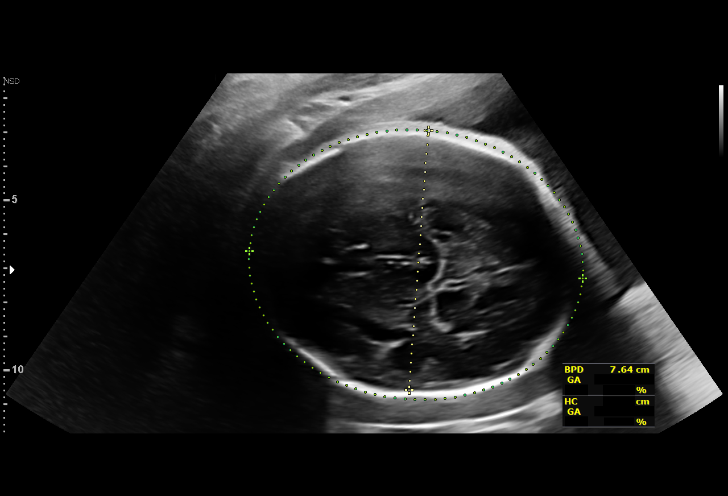
[im 6/26]
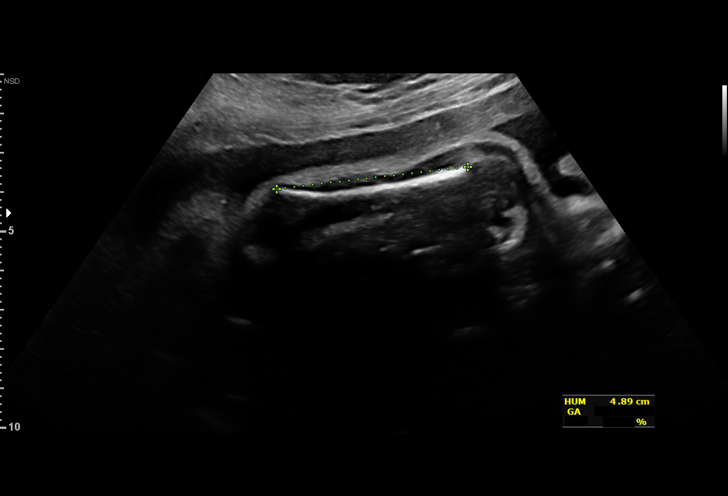
[im 8/26]
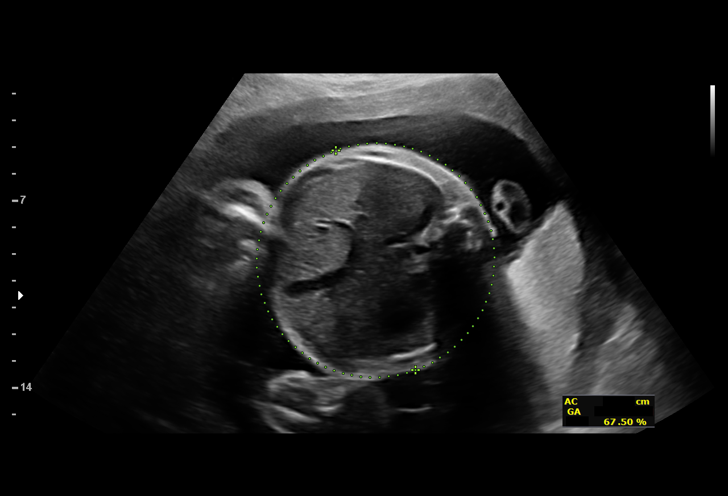
[im 10/26]
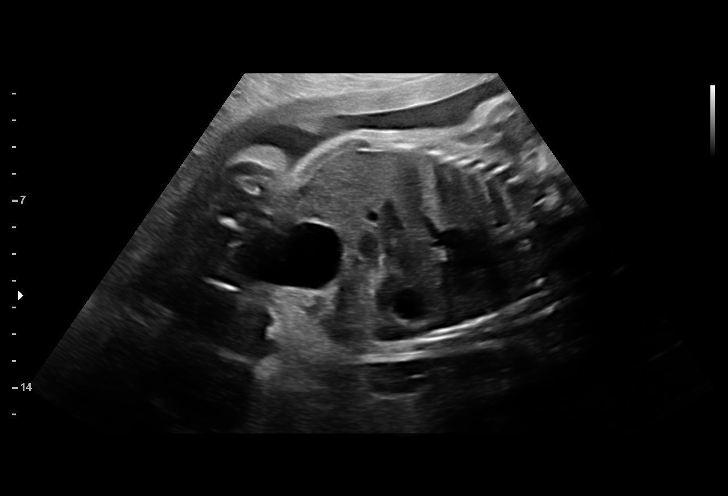
[im 12/26]
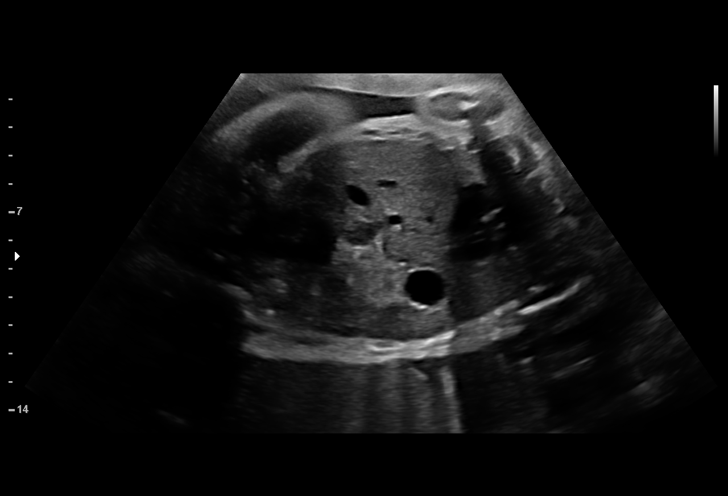
[im 14/26]
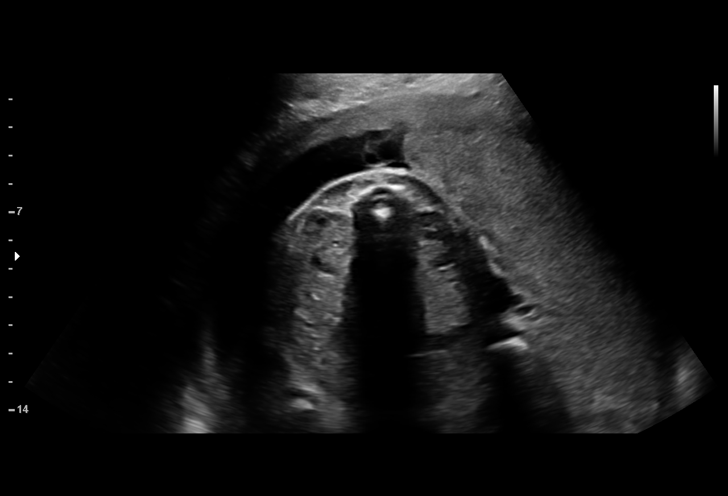
[im 16/26]
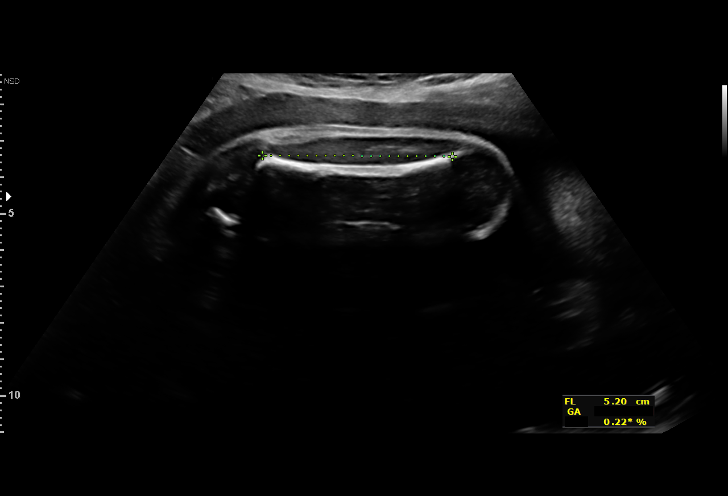
[im 18/26]
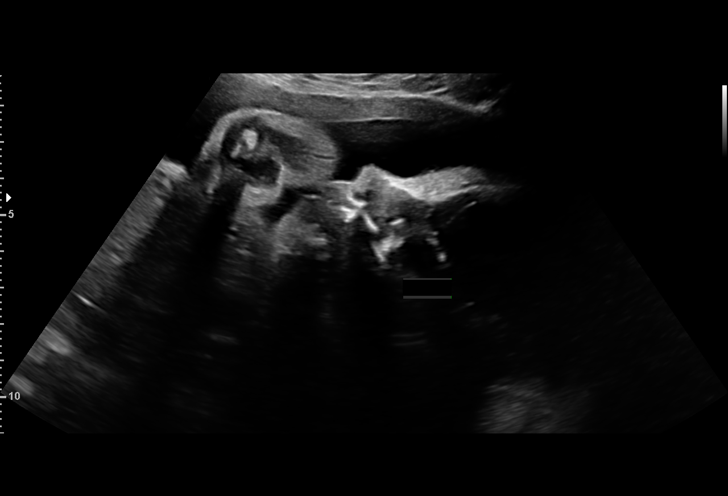
[im 20/26]
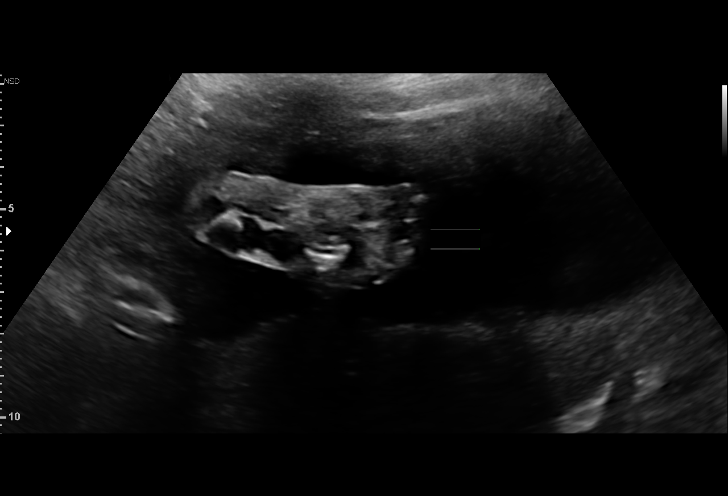
[im 22/26]
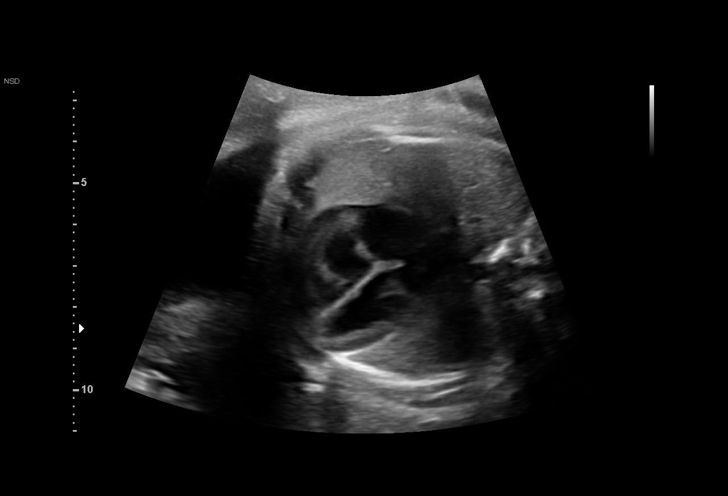
[im 24/26]
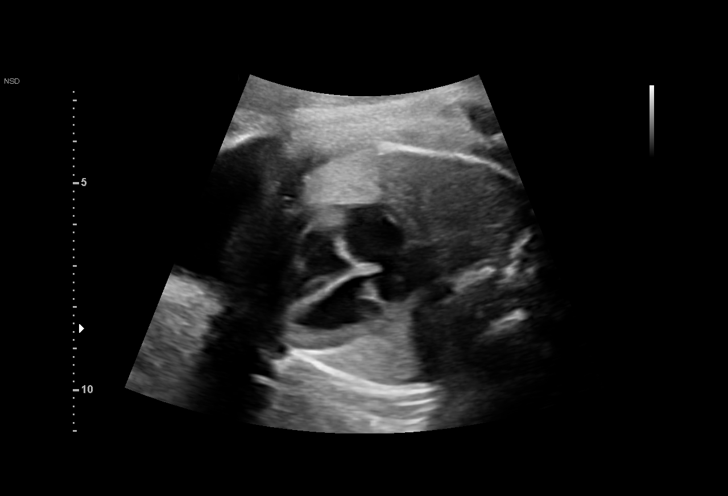
[im 26/26]
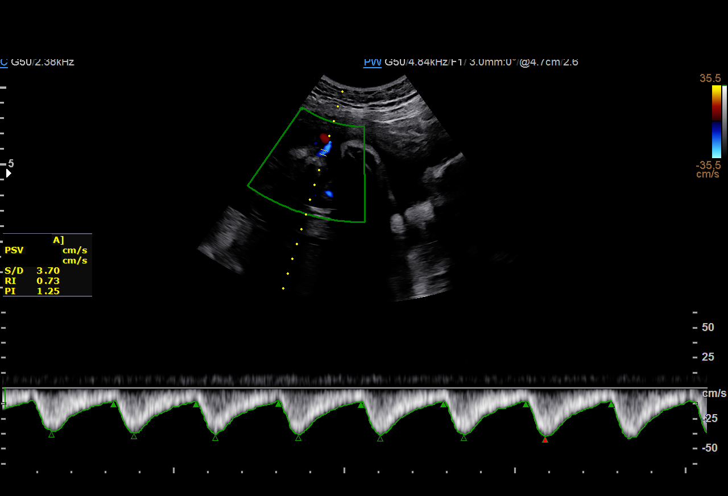

[13 of 26 positions shown; findings below may reference images not displayed]

[address]

 ----------------------------------------------------------------------

 ----------------------------------------------------------------------
Indications

  Drug dependence complicating pregnancy,
  antepartum condition or complication
  (Methadone, Benzos, THC)
  Antenatal screening for malformations
  30 weeks gestation of pregnancy
  Poor obstetric history: Previous preterm
  delivery, antepartum
  Late to prenatal care, third trimester
 ----------------------------------------------------------------------
Fetal Evaluation

 Num Of Fetuses:         1
 Cardiac Activity:       Observed
 Presentation:           Cephalic
 Placenta:               Posterior
 P. Cord Insertion:      Previously Visualized

 Amniotic Fluid
 AFI FV:      Within normal limits

 AFI Sum(cm)     %Tile       Largest Pocket(cm)
 18.65           70

 RUQ(cm)       RLQ(cm)       LUQ(cm)        LLQ(cm)

Biometry

 BPD:      76.4  mm     G. Age:  30w 5d         32  %    CI:        73.97   %    70 - 86
                                                         FL/HC:      18.4   %    19.3 -
 HC:      282.1  mm     G. Age:  30w 6d         17  %    HC/AC:      1.02        0.96 -
 AC:      277.5  mm     G. Age:  31w 6d         74  %    FL/BPD:     67.9   %    71 - 87
 FL:       51.9  mm     G. Age:  27w 5d        < 1  %    FL/AC:      18.7   %    20 - 24
 HUM:      49.2  mm     G. Age:  29w 0d         10  %
 LV:        6.6  mm
 Est. FW:    6937  gm      3 lb 7 oz     23  %
OB History

 Gravidity:    7         Term:   3        Prem:   1        SAB:   0
 TOP:          2       Ectopic:  0        Living: 4
Gestational Age

 U/S Today:     30w 2d                                        EDD:   12/02/19
 Best:          30w 6d     Det. By:  Previous Ultrasound      EDD:   11/28/19
                                     (06/15/19)
Anatomy

 Cranium:               Appears normal         Aortic Arch:            Previously seen
 Cavum:                 Previously seen        Ductal Arch:            Previously seen
 Ventricles:            Appears normal         Diaphragm:              Appears normal
 Choroid Plexus:        Previously seen        Stomach:                Appears normal, left
                                                                       sided
 Cerebellum:            Previously seen        Abdomen:                Appears normal
 Posterior Fossa:       Previously seen        Abdominal Wall:         Previously seen
 Nuchal Fold:           Not applicable (>20    Cord Vessels:           Previously seen
                        wks GA)
 Face:                  Orbits and profile     Kidneys:                Appear normal
                        previously seen
 Lips:                  Previously seen        Bladder:                Appears normal
 Thoracic:              Appears normal         Spine:                  Previously seen
 Heart:                 Appears normal         Upper Extremities:      Previously seen
                        (4CH, axis, and
                        situs)
 RVOT:                  Previously seen        Lower Extremities:      Previously seen
 LVOT:                  Previously seen

 Other:  Heels/feet and open hands/5th digits, and Nasal bone previously
         visualized.
Cervix Uterus Adnexa

 Cervix
 Normal appearance by transabdominal scan.
Comments

 This patient was seen for a follow up growth scan due to
 maternal treatment with methadone.  She denies any
 problems since her last exam.
 She was informed that the fetal growth and amniotic fluid
 level appears appropriate for her gestational age.
 A follow up exam was scheduled in 4 weeks.

## 2021-07-03 IMAGING — US US MFM OB FOLLOW-UP
1 series · 14 of 28 positions shown · non-contrast
Comparison: none

[Series 1: us mfm ob follow-up · 43 acquisitions, 14 frames shown]
[im 2/43]
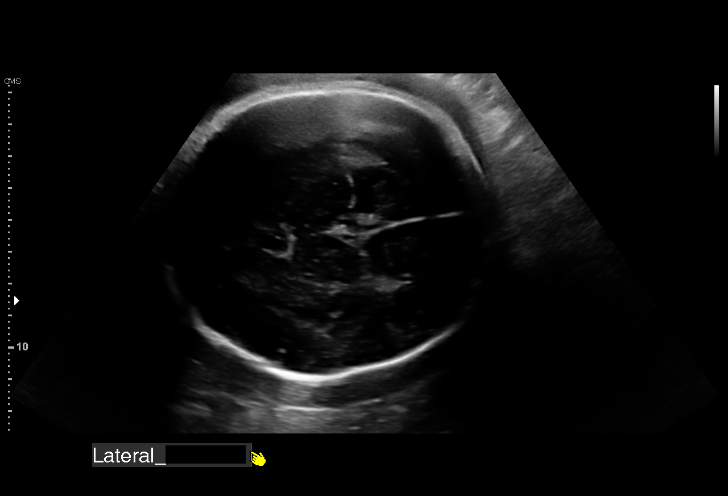
[im 5/43]
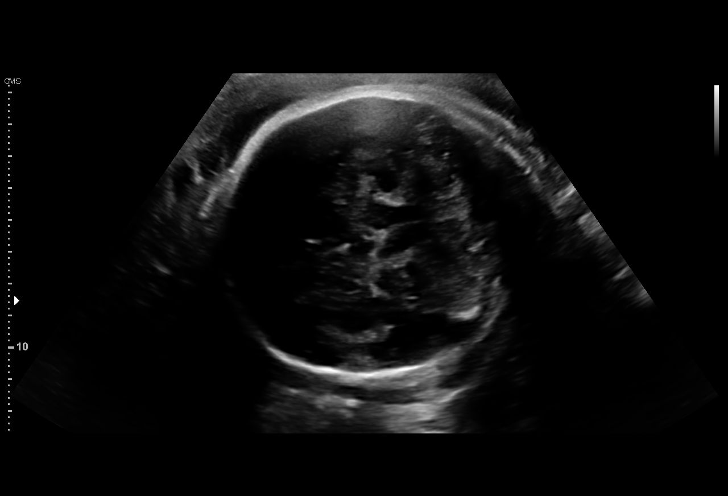
[im 8/43]
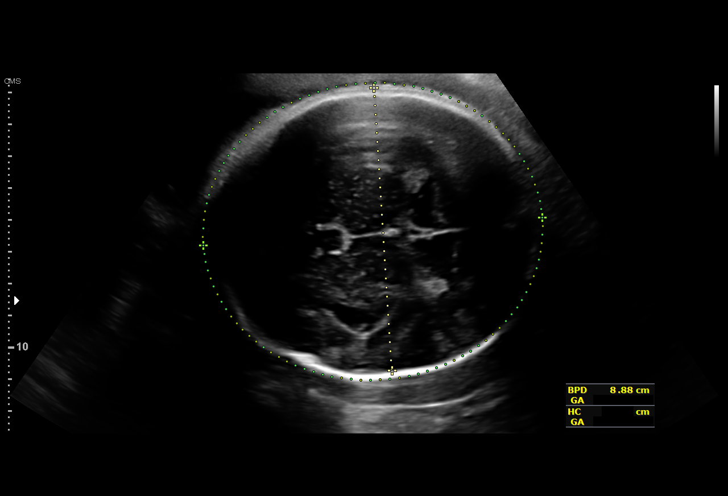
[im 11/43]
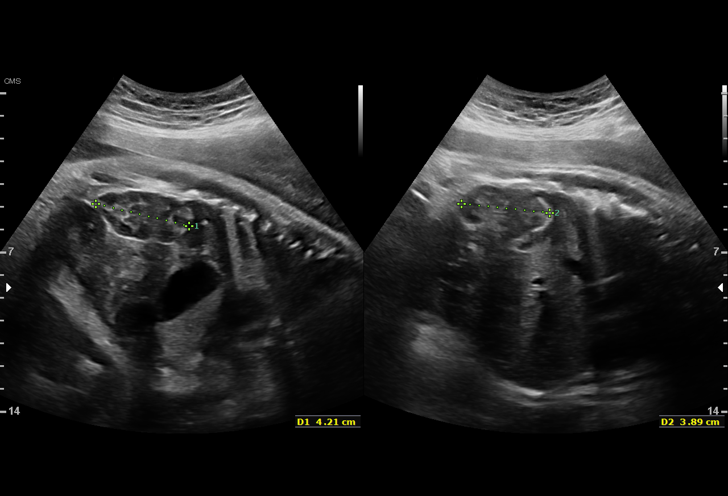
[im 15/43]
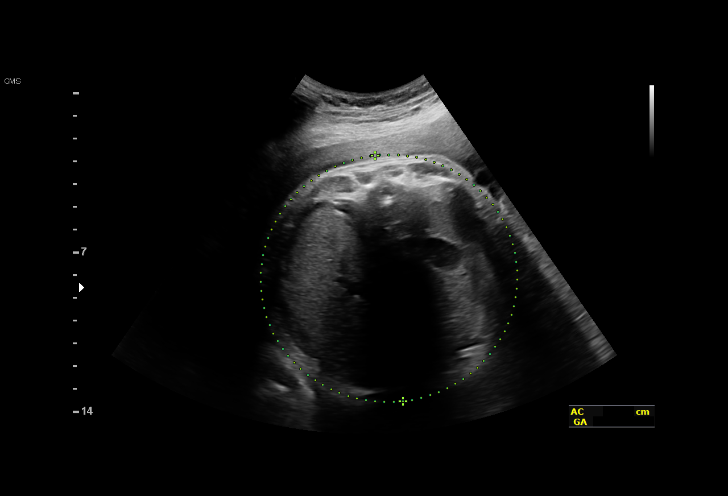
[im 18/43]
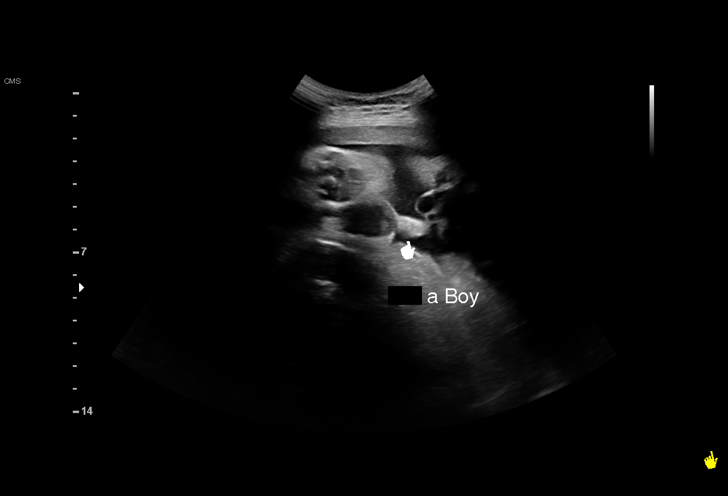
[im 21/43]
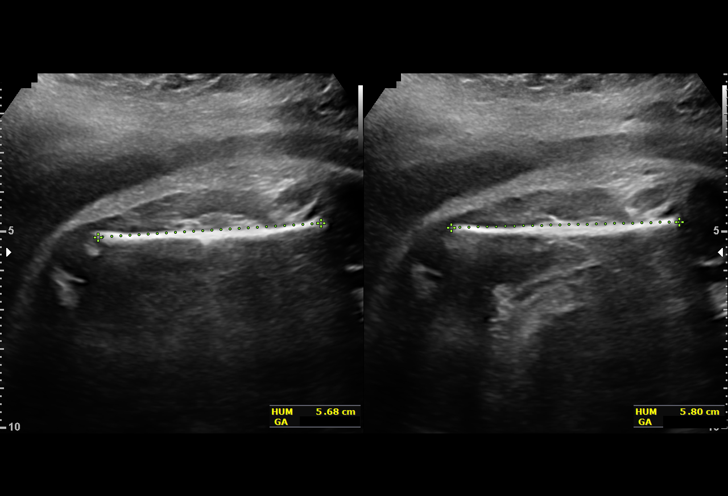
[im 24/43]
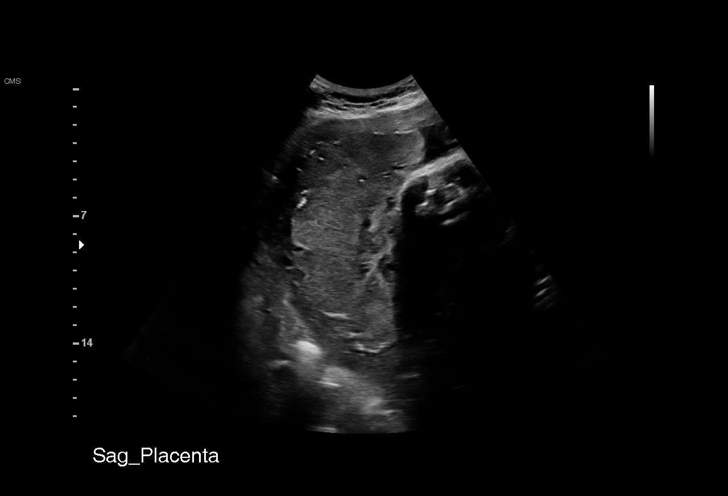
[im 27/43]
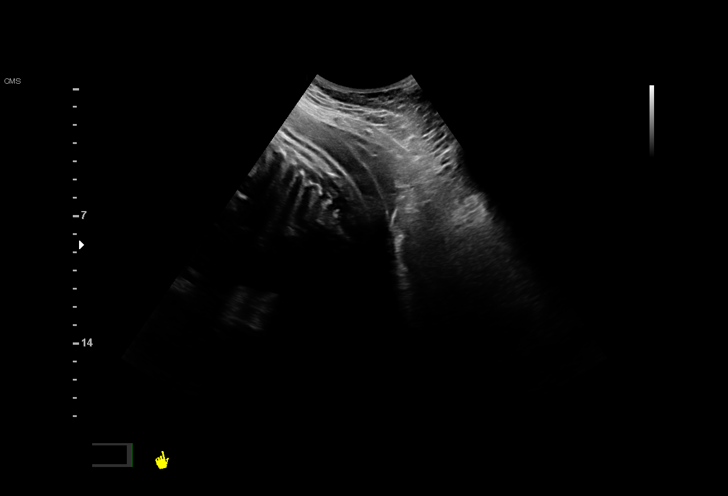
[im 30/43]
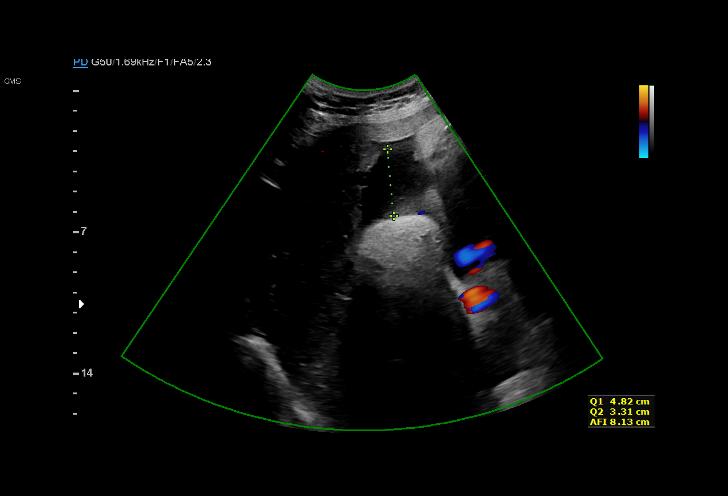
[im 33/43]
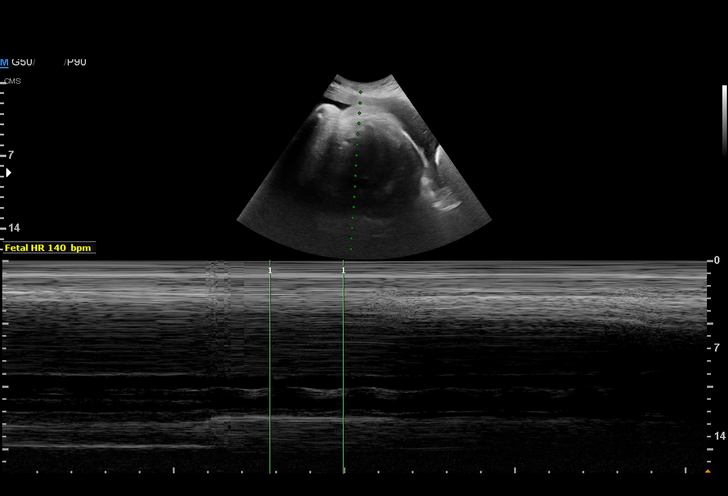
[im 36/43]
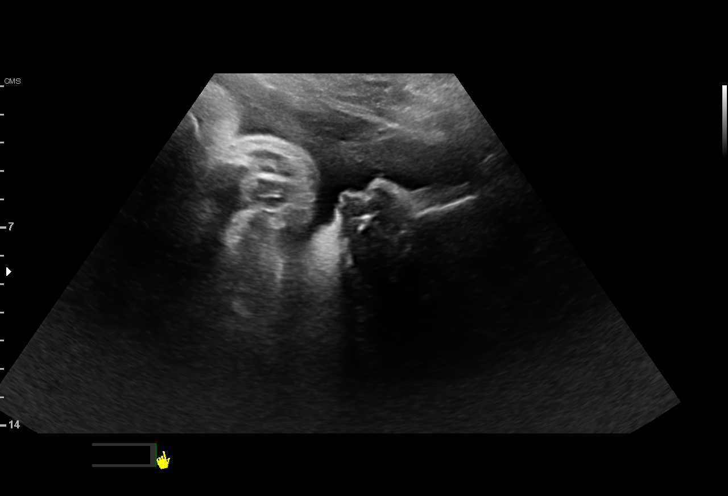
[im 39/43]
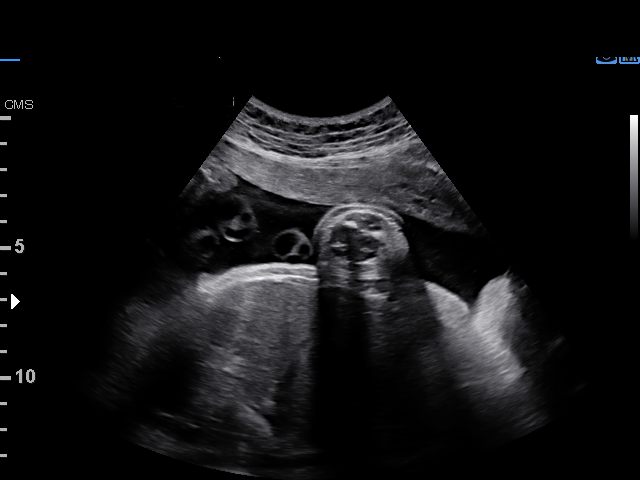
[im 43/43]
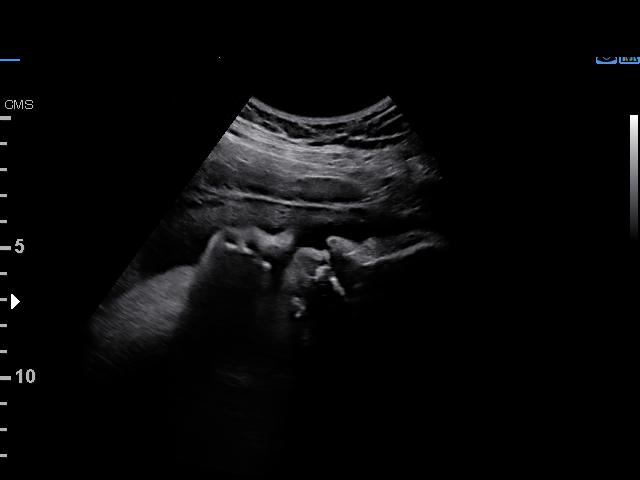

[14 of 28 positions shown; findings below may reference images not displayed]

[address]

 ----------------------------------------------------------------------

 ----------------------------------------------------------------------
Indications

  Drug dependence complicating pregnancy,
  antepartum condition or complication
  (Methadone, Benzos, THC)
  Encounter for other antenatal screening
  follow-up
  Poor obstetric history: Previous preterm
  delivery, antepartum
  Late to prenatal care, third trimester
  38 weeks gestation of pregnancy
 ----------------------------------------------------------------------
Fetal Evaluation

 Num Of Fetuses:         1
 Fetal Heart Rate(bpm):  140
 Cardiac Activity:       Observed
 Presentation:           Cephalic
 Placenta:               Posterior
 P. Cord Insertion:      Previously Visualized

 Amniotic Fluid
 AFI FV:      Within normal limits

 AFI Sum(cm)     %Tile       Largest Pocket(cm)
 11.31           37

 RUQ(cm)       RLQ(cm)       LUQ(cm)        LLQ(cm)

Biometry

 BPD:      88.6  mm     G. Age:  35w 6d         11  %    CI:        78.32   %    70 - 86
                                                         FL/HC:      20.8   %    20.6 -
 HC:      316.7  mm     G. Age:  35w 4d        < 1  %    HC/AC:      0.90        0.87 -
 AC:      351.2  mm     G. Age:  39w 0d         81  %    FL/BPD:     74.5   %    71 - 87
 FL:         66  mm     G. Age:  34w 0d        < 1  %    FL/AC:      18.8   %    20 - 24
 HUM:      57.6  mm     G. Age:  33w 3d        < 5  %

 Est. FW:    0533  gm    6 lb 14 oz      30  %
OB History

 Gravidity:    7         Term:   3        Prem:   1        SAB:   0
 TOP:          2       Ectopic:  0        Living: 4
Gestational Age

 U/S Today:     36w 1d                                        EDD:   12/15/19
 Best:          38w 4d     Det. By:  Previous Ultrasound      EDD:   11/28/19
                                     (06/15/19)
Anatomy

 Cranium:               Appears normal         Aortic Arch:            Previously seen
 Cavum:                 Previously seen        Ductal Arch:            Previously seen
 Ventricles:            Appears normal         Diaphragm:              Appears normal
 Choroid Plexus:        Previously seen        Stomach:                Appears normal, left
                                                                       sided
 Cerebellum:            Previously seen        Abdomen:                Appears normal
 Posterior Fossa:       Previously seen        Abdominal Wall:         Previously seen
 Nuchal Fold:           Not applicable (>20    Cord Vessels:           Previously seen
                        wks GA)
 Face:                  Orbits and profile     Kidneys:                Appear normal
                        previously seen
 Lips:                  Previously seen        Bladder:                Appears normal
 Thoracic:              Appears normal         Spine:                  Previously seen
 Heart:                 Appears normal         Upper Extremities:      Previously seen
                        (4CH, axis, and
                        situs)
 RVOT:                  Previously seen        Lower Extremities:      Previously seen
 LVOT:                  Previously seen

 Other:  Heels/feet and open hands/5th digits and nasal bone previously
         visualized.
Cervix Uterus Adnexa

 Cervix
 Not visualized (advanced GA >70wks)

 Uterus
 No abnormality visualized.

 Left Ovary
 Not visualized.

 Right Ovary
 Not visualized.

 Cul De Sac
 No free fluid seen.

 Adnexa
 No abnormality visualized.
Comments

 This patient was seen for a follow up growth scan due to
 maternal methadone treatment.
 The fetal growth and amniotic fluid level appears appropriate
 for her gestational age.
 Follow-up as indicated.
